# Patient Record
Sex: Female | Born: 1990 | Marital: Single | Smoking: Never smoker
Health system: Northeastern US, Community
[De-identification: ages and names within clinical notes are randomized; demographics above are authoritative.]

## PROBLEM LIST (undated history)

## (undated) ENCOUNTER — Emergency Department (HOSPITAL_BASED_OUTPATIENT_CLINIC_OR_DEPARTMENT_OTHER): Payer: No Typology Code available for payment source

---

## 2018-07-15 ENCOUNTER — Emergency Department
Admission: EM | Admit: 2018-07-15 | Discharge: 2018-07-16 | Disposition: A | Discharge status: Other Acute Inpatient Hospital | Payer: No Typology Code available for payment source | Source: Intra-hospital | Attending: Emergency Medicine | Admitting: Emergency Medicine

## 2018-07-15 ENCOUNTER — Other Ambulatory Visit (HOSPITAL_BASED_OUTPATIENT_CLINIC_OR_DEPARTMENT_OTHER): Payer: Self-pay | Admitting: PCMHI

## 2018-07-15 ENCOUNTER — Telehealth (HOSPITAL_BASED_OUTPATIENT_CLINIC_OR_DEPARTMENT_OTHER): Payer: Self-pay | Admitting: Family Medicine

## 2018-07-15 ENCOUNTER — Ambulatory Visit: Payer: No Typology Code available for payment source | Attending: Family Medicine | Admitting: Family Medicine

## 2018-07-15 ENCOUNTER — Encounter (HOSPITAL_BASED_OUTPATIENT_CLINIC_OR_DEPARTMENT_OTHER): Payer: Self-pay | Admitting: Family Medicine

## 2018-07-15 VITALS — BP 121/80 | HR 102 | Temp 98.6°F | Ht 59.45 in | Wt 121.0 lb

## 2018-07-15 DIAGNOSIS — F322 Major depressive disorder, single episode, severe without psychotic features: Secondary | ICD-10-CM | POA: Diagnosis present

## 2018-07-15 DIAGNOSIS — F411 Generalized anxiety disorder: Principal | ICD-10-CM | POA: Insufficient documentation

## 2018-07-15 DIAGNOSIS — F418 Other specified anxiety disorders: Principal | ICD-10-CM | POA: Diagnosis present

## 2018-07-15 DIAGNOSIS — Z0000 Encounter for general adult medical examination without abnormal findings: Secondary | ICD-10-CM | POA: Diagnosis present

## 2018-07-15 DIAGNOSIS — R45851 Suicidal ideations: Secondary | ICD-10-CM | POA: Diagnosis present

## 2018-07-15 DIAGNOSIS — F410 Panic disorder [episodic paroxysmal anxiety] without agoraphobia: Secondary | ICD-10-CM | POA: Diagnosis not present

## 2018-07-15 LAB — BASIC METABOLIC PANEL
ANION GAP: 12 mmol/L (ref 5–15)
BUN (UREA NITROGEN): 10 mg/dL (ref 7–18)
CALCIUM: 9.2 mg/dL (ref 8.5–10.1)
CARBON DIOXIDE: 26 mmol/L (ref 21–32)
CHLORIDE: 104 mmol/L (ref 98–107)
CREATININE: 0.9 mg/dL (ref 0.4–1.2)
ESTIMATED GLOMERULAR FILT RATE: >60 mL/min (ref >60)
Glucose Random: 109 mg/dL (ref 74–160)
POTASSIUM: 3.4 mmol/L — ABNORMAL LOW (ref 3.5–5.1)
SODIUM: 142 mmol/L (ref 136–145)

## 2018-07-15 LAB — URINE DRUG SCREEN
AMPHETAMINES URINE: NEGATIVE
BENZODIAZEPINES URINE: NEGATIVE
CANNABINOIDS URINE: NEGATIVE
COCAINE METABOLITES URINE: NEGATIVE
ETHANOL URINE: NEGATIVE
OPIATES URINE: NEGATIVE

## 2018-07-15 LAB — CBC, PLATELET & DIFFERENTIAL
ABSOLUTE BASO COUNT: 0 10*3/uL (ref 0.0–0.1)
ABSOLUTE EOSINOPHIL COUNT: 0 10*3/uL (ref 0.0–0.8)
ABSOLUTE IMM GRAN COUNT: 0.01 10*3/uL (ref 0.00–0.03)
ABSOLUTE LYMPH COUNT: 1.5 10*3/uL (ref 0.6–5.9)
ABSOLUTE MONO COUNT: 0.6 10*3/uL (ref 0.2–1.4)
ABSOLUTE NEUTROPHIL COUNT: 6.2 10*3/uL (ref 1.6–8.3)
BASOPHIL %: 0.4 % (ref 0.0–1.2)
EOSINOPHIL %: 0.2 % (ref 0.0–7.0)
HEMATOCRIT: 36.3 % (ref 34.1–44.9)
HEMOGLOBIN: 12.6 g/dL (ref 11.2–15.7)
IMMATURE GRANULOCYTE %: 0.1 % (ref 0.0–0.4)
LYMPHOCYTE %: 18.3 % (ref 15.0–54.0)
MEAN CORP HGB CONC: 34.7 g/dL (ref 31.0–37.0)
MEAN CORPUSCULAR HGB: 29.7 pg (ref 26.0–34.0)
MEAN CORPUSCULAR VOL: 85.6 fL (ref 80.0–100.0)
MEAN PLATELET VOLUME: 10.1 fL (ref 8.7–12.5)
MONOCYTE %: 6.7 % (ref 4.0–13.0)
NEUTROPHIL %: 74.3 % (ref 40.0–75.0)
PLATELET COUNT: 306 10*3/uL (ref 150–400)
RBC DISTRIBUTION WIDTH STD DEV: 41.1 fL (ref 35.1–46.3)
RBC DISTRIBUTION WIDTH: 13.3 % (ref 11.5–14.3)
RED BLOOD CELL COUNT: 4.24 M/uL (ref 3.90–5.20)
WHITE BLOOD CELL COUNT: 8.3 10*3/uL (ref 4.0–11.0)

## 2018-07-15 LAB — SERUM DRUG SCREEN
ACETAMINOPHEN: <2 ug/mL (ref 10–30)
ETHANOL: <3 mg/dL (ref 0–3)
SALICYLATE: <2.8 mg/dL (ref 2.8–20.0)

## 2018-07-15 LAB — URINE PREGNANCY TEST (POINT OF CARE): HCG QUALITATIVE URINE: NEGATIVE

## 2018-07-15 MED ORDER — LORAZEPAM 1 MG PO TABS
1.0000 mg | ORAL_TABLET | Freq: Once | ORAL | Status: AC
Start: 2018-07-15 — End: 2018-07-15
  Administered 2018-07-15: 1 mg via ORAL
  Filled 2018-07-15: qty 1
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
## 2018-07-15 NOTE — Narrator Note (Signed)
Pt demanding to speak to mother, pt helped to find phone number and now is speaking to mother stating she needs to come see her.

## 2018-07-15 NOTE — Narrator Note (Signed)
Pts family at bedside

## 2018-07-15 NOTE — Narrator Note (Signed)
RN giving handoff Sharlyn Bologna  RN receiving handoff Leah RN    Patient updated of plan of care Yes  Room Safety Check Done:  Yes  Patient Belongings Documented: Yes  Outstanding orders/meds addressed N/A  Patient Needs Addressed (Pain/Bathroom/ADL's) Yes  Restraints Documented: No  Is patient on constant observation Yes

## 2018-07-15 NOTE — Progress Notes (Signed)
I personally interviewed and examined this patient, along with Dr. Virl Cagey. I confirm the key elements of the history and physical, and agree with the assessment and plan as documented in the visit note.     Noreene Filbert, MD        Pt expressing feelings of extreme anxiety and depression. Recently moved from [place]. Overwhelming family situations. In private with resident expressed notions of suicidality, thinking daily about death. She knows wear a rope is and has considered using it. Also has considered placing an appliance in the bathtub. Initially denied intent to do anything tonight. No access to guns. On-call therapist consulted. Met with patient and in agreement that she is acutely suicidal. She was making multiple impulsive statements in the room. Writing on herself with pen and broke the pen. Decision made to complete Section-12 and have patient emergently evaluated for inpatient psychiatric admission. Ambulance and security notified. Pt and family notified and are in agreement.

## 2018-07-15 NOTE — Narrator Note (Signed)
Pt's family left phone number [phone] if any decision are made, pt agreeable to this, wants mother to be included.

## 2018-07-15 NOTE — Narrator Note (Signed)
Pt medicated for anxiety, mother stated pt is unable to sleep without medications. Pt's affect is flat, but accepted PO medication.

## 2018-07-15 NOTE — Narrator Note (Signed)
[facility].

## 2018-07-15 NOTE — Narrator Note (Signed)
Pt provided with pillow and blankets for comfort. Pt given box lunch per request.

## 2018-07-15 NOTE — Progress Notes (Unsigned)
NURSE from [facility] called the [facility] to complete a benefit analysis for the TDAP, HPV Vaccine.    The vaccine is covered under the patients prescription coverage.    Please choose Prior Auth
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
## 2018-07-15 NOTE — Narrator Note (Signed)
Pt changed belongings placed in a bin.

## 2018-07-15 NOTE — Telephone Encounter (Signed)
-----   Message from Su Hoff, LPN sent at 0/71/2524  1:26 PM EDT -----  Regarding: benefit review  Vaccine Benefit Analysis:    Needed for Tdap/HPV vaccine(s).  Is patient currently waiting in clinic? No - Appt Date: 08/01    Route to the nursing pool?  No
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
## 2018-07-16 ENCOUNTER — Encounter (HOSPITAL_BASED_OUTPATIENT_CLINIC_OR_DEPARTMENT_OTHER): Payer: Self-pay

## 2018-07-16 ENCOUNTER — Inpatient Hospital Stay
Admission: RE | Admit: 2018-07-16 | Discharge: 2018-07-20 | DRG: 756 | Disposition: A | Discharge status: Home / Self Care | Payer: No Typology Code available for payment source | Attending: Psychiatry | Admitting: Psychiatry

## 2018-07-16 DIAGNOSIS — Z62810 Personal history of physical and sexual abuse in childhood: Secondary | ICD-10-CM | POA: Diagnosis present

## 2018-07-16 DIAGNOSIS — F4312 Post-traumatic stress disorder, chronic: Secondary | ICD-10-CM | POA: Diagnosis present

## 2018-07-16 DIAGNOSIS — F410 Panic disorder [episodic paroxysmal anxiety] without agoraphobia: Secondary | ICD-10-CM | POA: Diagnosis present

## 2018-07-16 DIAGNOSIS — T43595A Adverse effect of other antipsychotics and neuroleptics, initial encounter: Secondary | ICD-10-CM | POA: Diagnosis not present

## 2018-07-16 DIAGNOSIS — F329 Major depressive disorder, single episode, unspecified: Secondary | ICD-10-CM | POA: Diagnosis present

## 2018-07-16 DIAGNOSIS — F419 Anxiety disorder, unspecified: Secondary | ICD-10-CM | POA: Diagnosis not present

## 2018-07-16 DIAGNOSIS — G2571 Drug induced akathisia: Secondary | ICD-10-CM | POA: Diagnosis not present

## 2018-07-16 DIAGNOSIS — R45851 Suicidal ideations: Secondary | ICD-10-CM | POA: Diagnosis not present

## 2018-07-16 DIAGNOSIS — F322 Major depressive disorder, single episode, severe without psychotic features: Secondary | ICD-10-CM

## 2018-07-16 DIAGNOSIS — F4310 Post-traumatic stress disorder, unspecified: Secondary | ICD-10-CM | POA: Diagnosis not present

## 2018-07-16 MED ORDER — MAGNESIUM HYDROXIDE 400 MG/5ML PO SUSP
30.0000 mL | Freq: Two times a day (BID) | ORAL | Status: DC | PRN
Start: 2018-07-16 — End: 2018-07-20

## 2018-07-16 MED ORDER — MIRTAZAPINE 15 MG PO TABS
15.0000 mg | ORAL_TABLET | Freq: Every evening | ORAL | Status: DC
Start: 2018-07-16 — End: 2018-07-16

## 2018-07-16 MED ORDER — CLONAZEPAM 0.5 MG PO TABS
0.5000 mg | ORAL_TABLET | Freq: Two times a day (BID) | ORAL | Status: DC | PRN
Start: 2018-07-16 — End: 2018-07-17
  Administered 2018-07-16: 0.5 mg via ORAL
  Filled 2018-07-16: qty 1

## 2018-07-16 MED ORDER — ACETAMINOPHEN 325 MG PO TABS
650.0000 mg | ORAL_TABLET | ORAL | Status: DC | PRN
Start: 2018-07-16 — End: 2018-07-20

## 2018-07-16 MED ORDER — FLUOXETINE HCL 10 MG PO CAPS
10.0000 mg | ORAL_CAPSULE | Freq: Every day | ORAL | Status: DC
Start: 2018-07-17 — End: 2018-07-19
  Administered 2018-07-17 – 2018-07-19 (×3): 10 mg via ORAL
  Filled 2018-07-16 (×3): qty 1

## 2018-07-16 MED ORDER — FLUOXETINE HCL 10 MG PO CAPS
10.0000 mg | ORAL_CAPSULE | Freq: Every day | ORAL | Status: DC
Start: 2018-07-16 — End: 2018-07-16

## 2018-07-16 MED ORDER — OLANZAPINE 5 MG PO TBDP
2.5000 mg | ORAL_TABLET | Freq: Every day | ORAL | Status: DC | PRN
Start: 2018-07-16 — End: 2018-07-17
  Administered 2018-07-17: 2.5 mg via ORAL
  Filled 2018-07-16: qty 1

## 2018-07-16 MED ORDER — MELATONIN 3 MG PO TABS
3.0000 mg | ORAL_TABLET | Freq: Every evening | ORAL | Status: DC | PRN
Start: 2018-07-16 — End: 2018-07-18
  Administered 2018-07-16: 3 mg via ORAL
  Filled 2018-07-16: qty 1

## 2018-07-16 MED ORDER — HYDROXYZINE HCL 25 MG PO TABS
25.0000 mg | ORAL_TABLET | Freq: Three times a day (TID) | ORAL | Status: DC | PRN
Start: 2018-07-16 — End: 2018-07-16

## 2018-07-16 MED ORDER — ALUMINUM-MAGNESIUM-SIMETHICONE 200-200-20 MG/5ML PO SUSP
30.0000 mL | ORAL | Status: DC | PRN
Start: 2018-07-16 — End: 2018-07-20

## 2018-07-16 MED ORDER — TRAZODONE HCL 50 MG PO TABS
50.0000 mg | ORAL_TABLET | Freq: Every evening | ORAL | Status: DC | PRN
Start: 2018-07-16 — End: 2018-07-16

## 2018-07-16 NOTE — Narrator Note (Signed)
Pt is calm, laying on stretcher and watching through her window.
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
## 2018-07-16 NOTE — Narrator Note (Signed)
Pt ambulated out to nurses station and asks at what time her 72 hour held started as she was informed this morning that she had a bed.

## 2018-07-16 NOTE — Narrator Note (Signed)
Pt is aware that she will be an inpatient bed search. Pt remains calm.

## 2018-07-16 NOTE — Narrator Note (Signed)
Patient sleeping, chest rise and fall equal, patient in NAD, continue to monitor, security standing by. Awaiting eliot screening

## 2018-07-16 NOTE — Narrator Note (Signed)
[facility] screening in process. Pt family member Netty Starring called [phone] asks that pt call him when she is available.

## 2018-07-16 NOTE — Narrator Note (Signed)
Patient sleeping, chest rise and fall equal, patient in NAD, continue to monitor, security standing by.

## 2018-07-16 NOTE — Narrator Note (Signed)
Report to [place] [facility] ,

## 2018-07-16 NOTE — Progress Notes (Signed)
Patient signed CV after talking to Doctor. Talked to mom and dad on the phone and wanted nurse to speak with them as well, giving verbal permission.  Patient was visible on unit and anxious. Compliant with medications and meals but somewhat paranoid about staff and sleeping in a room alone. Patient blockaded door d/t fear but told nurse she would feel safer with a room-mate. Room change done so that patient would have a roommate.

## 2018-07-16 NOTE — Narrator Note (Signed)
Report to Kerry RN

## 2018-07-16 NOTE — ED Provider Notes (Signed)
Patient signed out to me by Dr. Jani Files.    ED Course as of Jul 16 1514   Fri Jul 16, 2018   0714 26 yo woman with anxiety/SI. Medically clear. Sent here on a Section 12. Eliot evaluated. Involuntary IPLOC. Bed search.     [MV]     No events during my shift. Signed out to Dr. Leary Roca.

## 2018-07-16 NOTE — Narrator Note (Signed)
Patient sleeping, chest rise and fall equal, patient in NAD, continue to monitor, security standing by. Awaiting [facility] screening
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
## 2018-07-16 NOTE — Narrator Note (Signed)
Pt ambulated to the restroom with steady gait. Awaiting eliot screening. patient in NAD, continue to monitor, security standing by.

## 2018-07-16 NOTE — Narrator Note (Signed)
Pt is sitting on stretcher and talking calmly with roommate.

## 2018-07-16 NOTE — Narrator Note (Signed)
Assumed care of pt who is awake and awaits [facility] screening. Pt is calm at this time,.

## 2018-07-16 NOTE — Narrator Note (Signed)
[facility] called and admission process initiated.
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
## 2018-07-16 NOTE — Narrator Note (Signed)
Patient Disposition    Patient education for diagnosis, medications, activity, diet and follow-up.  Patient left ED 4:44 PM.  Patient rep received written instructions.  Interpreter to provide instructions: No    Patient belongings with patient: YES    Have all existing LDAs been addressed? Yes    Have all IV infusions been stopped? Yes    Discharged to: Admit to:  [facility]

## 2018-07-17 DIAGNOSIS — F419 Anxiety disorder, unspecified: Secondary | ICD-10-CM

## 2018-07-17 DIAGNOSIS — F4310 Post-traumatic stress disorder, unspecified: Secondary | ICD-10-CM

## 2018-07-17 DIAGNOSIS — F329 Major depressive disorder, single episode, unspecified: Secondary | ICD-10-CM

## 2018-07-17 DIAGNOSIS — R45851 Suicidal ideations: Secondary | ICD-10-CM

## 2018-07-17 MED ORDER — OLANZAPINE 5 MG PO TBDP
ORAL_TABLET | ORAL | Status: AC
Start: 2018-07-17 — End: 2018-07-17
  Administered 2018-07-17: 10 mg via ORAL
  Filled 2018-07-17: qty 2

## 2018-07-17 MED ORDER — OLANZAPINE 10 MG PO TBDP
10.00 mg | ORAL_TABLET | Freq: Once | ORAL | Status: AC
Start: 2018-07-17 — End: 2018-07-17
  Administered 2018-07-17: 10 mg via ORAL

## 2018-07-17 MED ORDER — OLANZAPINE 10 MG PO TBDP
10.0000 mg | ORAL_TABLET | Freq: Two times a day (BID) | ORAL | Status: DC | PRN
Start: 2018-07-17 — End: 2018-07-18
  Administered 2018-07-17 (×2): 10 mg via ORAL
  Filled 2018-07-17 (×2): qty 1

## 2018-07-17 NOTE — Plan of Care (Addendum)
Problem: Mood-related symptoms  Description  Pt expressed SI related to worsening depression   Goal: Reduction in Intensity and Frequency (Mood)  Description  Patient will be able to express her feelings to staff and identify appropriate coping skills   Note:   Patient woke up this morning very anxious, agitated and restless. 1:1 support given to patient. Patient was not responding to redirections. Patient pacing the halls and attempted to escape the unit when housekeeping open the unit door.This nurse approached  the patient and brought her to her room.Prn Zyprexa 10 mg given with positive effect. Later in shift patient appeared less anxious visible on the unit and took a nap around 1:00 pm. Denies SI/HI/AH/VH. Patient states "Ijust want to get out of here".  Patient remains on 15 min checks and ur for safety.

## 2018-07-17 NOTE — Plan of Care (Signed)
Awake at start of shift. Anxious mood and affect. Declined PRN medication and retired to bed. Awoke around 0230 and was again highly anxious. Accepted zydis 2.5 mg with good effect. Awoke again around 0600 and went down to the TV room. Shortly later pt noted to be yelling out the window "help me, get me out". Remained panicked and minimally responded to support. Dr [name]  informed and an order for Zydis 10 mg obtained and given after initial resistance. Able to return to her room in a calmer state.

## 2018-07-17 NOTE — Plan of Care (Signed)
Problem: Mood-related symptoms  Goal: Reduction in Intensity and Frequency (Mood)  Description  Patient will be able to express her feelings to staff and identify appropriate coping skills   Outcome: Not Progressing   Patient presents with a depressed mood,sad affect,reported feeling anxious,restless.Prn Zyprexa administered with good effect, was able to take a nap, verbalized feeling much better.Had a good visit  with family.Will continue to monitor,offer support,motivation and a therapeutic environment.

## 2018-07-17 NOTE — H&P (Signed)
Department of Psychiatry  Admission Physical Examination, Review of Systems, & Medical Clearance    HPI:   27 yo F w/ PMH depression, anxiety, PTSD w/ increasing anxiety, SI, admitted to [facility], in need of medical clearance.    Social History    Social History Narrative      Not on file    Past Medical Hx:    Patient denies any PMH/ongoing medical issues      Prior to Admission Medications:    No medications prior to admission.    Scheduled Medications:   FLUoxetine  10 mg Oral Daily       PRN Medications:  Current Facility-Administered Medications   Medication Dose Route Frequency Last Dose    OLANZapine zydis  10 mg Oral Q12H PRN 10 mg at 07/17/18 1005    acetaminophen  650 mg Oral Q4H PRN      magnesium hydroxide  30 mL Oral Q12H PRN      aluminum-magnesium hydroxide-simethicone  30 mL Oral Q2H PRN      melatonin  3 mg Oral Nightly PRN 3 mg at 07/16/18 2149       ALLERGIES:   Review of Patient's Allergies indicates:  No Known Allergies    REVIEW OF SYSTEMS:    + anxious    + irregular menses (last "early July")    Remainder of ROS non contributory    PHYSICAL EXAM:  BP 130/81  Pulse 98  Temp 98.4 F (36.9 C) (Temporal)  Resp 18  Ht 5' 0.63" (1.54 m)  Wt 48.1 kg (106 lb)  SpO2 100%  BMI 20.27 kg/m2  Pain Score: 0 (0/10)          General: calm initially, as interview progressed patient appeared increasingly more anxious and started to tremble.  HEENT: wnl  Lungs:clear to A  Heart: normal and regular rate and rhythm  Abdomen: soft, non-tender. . No masses,  no organomegaly  Genital: defer exam  Rectal: deferred  Extremities: extremities normal, atraumatic, no cyanosis or edema  Neurological Screen:   Cranial Nerves:   I: smell: Not tested  II Visual acuity: Grossly intact  III, VI: Ptosis: Absent  III, IV, V: EOM: Intact  V: Mastication/facial light touch: normal  V VII: Corneal reflex: Not tested  VII: Facial muscle function: Normal  VIII: Hearing: Grossly intact  IX: Soft palate elevation:  Not tested  IX, X: Gag reflex: Not tested  XI: Neck flexion/trapezius strength/SCM strength: 5/5  XII: Tongue strength: wnl   Strength: Gait and station normal.. No obvious asymmetry, decrease in ROM, instability or weakness of spine or extremities  Sensation/Position/Pain: normal to touch      Test results:    Admission labs:  wnl  Tox screen negative    HCG:  Negative    Primary Care Physician:  None    Findings & Recommendations:  No new/acute medical issues  (irregular menses can be addressed as outpatient)    The patient has been examined, and the chart has been reviewed,  with findings as documented above.  The patient is medically stable and cleared for admission.  The patient may participate in exercise while in the hospital.    Arville Care, PA-C, 07/17/2018

## 2018-07-18 DIAGNOSIS — F322 Major depressive disorder, single episode, severe without psychotic features: Secondary | ICD-10-CM

## 2018-07-18 MED ORDER — OLANZAPINE 5 MG PO TBDP
5.0000 mg | ORAL_TABLET | Freq: Four times a day (QID) | ORAL | Status: DC | PRN
Start: 2018-07-18 — End: 2018-07-19
  Administered 2018-07-18 – 2018-07-19 (×3): 5 mg via ORAL
  Filled 2018-07-18 (×3): qty 1

## 2018-07-18 MED ORDER — MELATONIN 3 MG PO TABS
1.5000 mg | ORAL_TABLET | Freq: Every evening | ORAL | Status: DC
Start: 2018-07-18 — End: 2018-07-19
  Administered 2018-07-18: 1.5 mg via ORAL
  Filled 2018-07-18: qty 1

## 2018-07-18 NOTE — Plan of Care (Signed)
Problem: Mood-related symptoms  Goal: Reduction in Intensity and Frequency (Mood)  Description  Patient will be able to express her feelings to staff and identify appropriate coping skills   Note:   Pt has been visible on he unit. Pt is calm, quiet and cooperative. Pt was adherent to meals and medications. Pt is selectively social. RN met with pt and pt endorses feeling sad. She denies any SI/HI/AH/VH. Pt states she will like to be discharged home. Pt was encouraged to speak with team. Supportive 1:1 offered and coping skills encouraged. Will continue to monitor for safety.

## 2018-07-18 NOTE — Plan of Care (Signed)
Problem: Mood-related symptoms  Description  Pt expressed SI related to worsening depression   Goal: Reduction in Intensity and Frequency (Mood)  Description  Patient will be able to express her feelings to staff and identify appropriate coping skills   Intervention: (Nurse) Assess Mental Status  Description  RN will complete assessment/reassessment of patient's mental status every shift   Note:   Pt slept well this shift, remains on 15 minute checks. No issues noted

## 2018-07-18 NOTE — Plan of Care (Signed)
Problem: Mood-related symptoms  Description  Pt expressed SI related to worsening depression   Goal: Reduction in Intensity and Frequency (Mood)  Description  Patient will be able to express her feelings to staff and identify appropriate coping skills   07/18/2018 2136 by Karl Luke, RN  Note:   Pt had a long visit with her family working on a puzzle. Pt C/O increase anxiety and requested PRN Olanzapine which was administered with good effect. Pt did socialize with peers. Pt told this Probation officer that she wants to go home and be with her family.Support offered and coping skills encouraged. Pt denies any safety concerns this shift.  07/18/2018 1405 by Karl Luke, RN  Note:   Pt has been visible on he unit. Pt is calm, quiet and cooperative. Pt was adherent to meals and medications. Pt is selectively social. RN met with pt and pt endorses feeling sad. She denies any SI/HI/AH/VH. Pt states she will like to be discharged home. Pt was encouraged to speak with team. Supportive 1:1 offered and coping skills encouraged. Will continue to monitor for safety.

## 2018-07-18 NOTE — Progress Notes (Signed)
Identifying Data:  Patient is a 27 year old female.      INTERVAL HISTORY: Yesterday I decreased her PRN Zypexa to 5 mg, but she remains organized and cooperative, without any major behavioral or medical events overnight, cooperative and compliant with medications, has fair sleep and appetite.     VITALS:  BP 124/78  Pulse 92  Temp 97.6 F (36.4 C) (Temporal)  Resp 18  Ht 5' 0.63" (1.54 m)  Wt 47.2 kg (104 lb 1.6 oz)  SpO2 100%  BMI 19.91 kg/m2   Pain Score: 0 (0/10)    Scheduled Meds:   melatonin  1.5 mg Oral Nightly    FLUoxetine  10 mg Oral Daily       PRN Meds:  Current Facility-Administered Medications   Medication Dose Route Frequency Last Dose    OLANZapine zydis  5 mg Oral Q6H PRN 5 mg at 07/18/18 1648    acetaminophen  650 mg Oral Q4H PRN      magnesium hydroxide  30 mL Oral Q12H PRN      aluminum-magnesium hydroxide-simethicone  30 mL Oral Q2H PRN           Mental Status Examination Notable Findings:  "I feel better ... I draw, I saw my mother and father"  General appearance: acceptable self care, appropriate clothing. Behavior: watching TV before interview, follows me to office, cooperative.  Alertness: awake, alert.   Memory and orientation: at least grossly intact, oriented to place, time, situation.  Speech is fluent, coherent, with age-appropriate rate, prosody, volume. Vocabulary: not formally assessed, but without obvious limitations.  Mannerism/movements: Normal psychomotor activities, no mannerisms.  Thought process is logical, linear.   Thought content and delusions: Not overtly paranoid, there is no formal thought disorder. However, cannot rule out paranoia, says she is "overthinking" and is anxious because staff are "staring" at her.  Hallucinations: Not distracted or preoccupied.  Mood: "I'm better". Affect is constricted but reactive, not as tense as yesterday   No suicidal ideation; future oriented.   No homicidal thoughts.  Insight and judgment are limited.      Review of Patient's  Allergies indicates:  No Known Allergies      C-SSRS:  C-SSRS Frequent Screener  Since last contact, have you actually had thoughts about killing yourself ? : No  Have you done anything, started to do anything, or prepared to do anything to end your life? : No  C-SSRS Frequent Screener Risk Score: 0    C-SSRS Risk Assessment  Activating Events (Recent): Recent loss(es) or other significant negative event(s) (legal, financial, relationship, etc.) (Describe) (07/16/18 1841)  Protective Factors (Recent): Supportive social network or family (07/16/18 1842)      Assessment and plan:    Primary diagnosis:  MDD, severe  R/o PTSD, r/o psychosis    Continue current treatment.    I spent about 15 minutes face to face with patient and unit/floor time, of which less than 50% was face to face evaluation and more than 50% was coordination of care (including documentation, obtaining information from sign out and/or discussing care with nursing) and/or counseling patient and/or family.        This font size and color are used for regulatory/compliance elements.
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
## 2018-07-18 NOTE — Progress Notes (Addendum)
Identifying Data:  Patient is a 27 year old female, admitted yesterday for severe anxiety, depression, SI.      INTERVAL HISTORY: Was repeatedly very agitated, tried to leave the unit in the morning. Had partial response to Zyprexa 2.5 mg; increased dose to 10 mg, took it 2 today, fell asleep both times after.    VITALS:  BP 130/81  Pulse 98  Temp 98.4 F (36.9 C) (Temporal)  Resp 18  Ht 5' 0.63" (1.54 m)  Wt 48.1 kg (106 lb)  SpO2 100%  BMI 20.27 kg/m2   Pain Score: 0 (0/10)    Scheduled Meds:   melatonin  1.5 mg Oral Nightly    FLUoxetine  10 mg Oral Daily       PRN Meds:  Current Facility-Administered Medications   Medication Dose Route Frequency Last Dose    OLANZapine zydis  10 mg Oral Q12H PRN 10 mg at 07/17/18 1646    acetaminophen  650 mg Oral Q4H PRN      magnesium hydroxide  30 mL Oral Q12H PRN      aluminum-magnesium hydroxide-simethicone  30 mL Oral Q2H PRN           Mental Status Examination Notable Findings:  General appearance: acceptable self care, appropriate clothing. Behavior: cooperative.  Alertness: asleep, but wakes up easily  Memory and orientation: at least grossly intact, oriented to place, time, situation.  Speech is fluent, coherent, with age-appropriate rate, prosody, volume. Vocabulary: not formally assessed, but without obvious limitations.  Mannerism/movements: Normal psychomotor activities, no mannerisms.  Thought process is logical, linear.   Thought content and delusions: men on the unit make her uncomfortable: "they are strong and scare me"  Hallucinations: Not distracted or preoccupied.  Mood: Depressed and anxious. Affect is flat.   No suicidal ideation.   No homicidal thoughts.  Insight and judgment are limited.      Review of Patient's Allergies indicates:  No Known Allergies      C-SSRS:  C-SSRS Frequent Screener  Since last contact, have you actually had thoughts about killing yourself ? : (P) No  Have you done anything, started to do anything, or prepared to do  anything to end your life? : (P) No  C-SSRS Frequent Screener Risk Score: (P) 0    C-SSRS Risk Assessment  Activating Events (Recent): Recent loss(es) or other significant negative event(s) (legal, financial, relationship, etc.) (Describe) (07/16/18 1841)  Protective Factors (Recent): Supportive social network or family (07/16/18 1842)      Assessment and plan:    Primary diagnosis:  MDD, severe   R/o PTSD (r/o, maybe, psychosis?)  Continue current treatment.    I spent about 15 minutes face to face with patient and unit/floor time, of which less than 50% was face to face evaluation and more than 50% was coordination of care (including documentation, obtaining information from sign out and/or discussing care with nursing) and/or counseling patient and/or family.        This font size and color are used for regulatory/compliance elements.

## 2018-07-19 ENCOUNTER — Other Ambulatory Visit (HOSPITAL_BASED_OUTPATIENT_CLINIC_OR_DEPARTMENT_OTHER): Payer: Self-pay | Admitting: Social Worker

## 2018-07-19 ENCOUNTER — Other Ambulatory Visit (HOSPITAL_BASED_OUTPATIENT_CLINIC_OR_DEPARTMENT_OTHER): Payer: Self-pay | Admitting: Psychiatry

## 2018-07-19 MED ORDER — OLANZAPINE 5 MG PO TBDP
5.0000 mg | ORAL_TABLET | Freq: Every evening | ORAL | Status: DC
Start: 2018-07-19 — End: 2018-07-20
  Administered 2018-07-19: 5 mg via ORAL
  Filled 2018-07-19: qty 1

## 2018-07-19 MED ORDER — FLUOXETINE HCL 20 MG PO CAPS: 20 mg | capsule | Freq: Every day | ORAL | 0 refills | 0 days | Status: DC

## 2018-07-19 MED ORDER — CLONAZEPAM 1 MG PO TABS
1.0000 mg | ORAL_TABLET | Freq: Two times a day (BID) | ORAL | Status: DC
Start: 2018-07-19 — End: 2018-07-20
  Administered 2018-07-19 – 2018-07-20 (×2): 1 mg via ORAL
  Filled 2018-07-19 (×2): qty 1

## 2018-07-19 MED ORDER — OLANZAPINE 5 MG PO TBDP
5.0000 mg | ORAL_TABLET | Freq: Every evening | ORAL | 0 refills | Status: DC | PRN
Start: 2018-07-20 — End: 2018-08-09

## 2018-07-19 MED ORDER — CLONAZEPAM 1 MG PO TABS
1.0000 mg | ORAL_TABLET | Freq: Two times a day (BID) | ORAL | 0 refills | Status: DC
Start: 2018-07-19 — End: 2018-08-09

## 2018-07-19 MED ORDER — MELATONIN 3 MG PO TABS
3.0000 mg | ORAL_TABLET | Freq: Every evening | ORAL | Status: DC
Start: 2018-07-19 — End: 2018-07-20
  Administered 2018-07-19: 3 mg via ORAL
  Filled 2018-07-19: qty 1

## 2018-07-19 MED ORDER — MELATONIN 3 MG PO TABS: 3 mg | tablet | Freq: Every evening | ORAL | 0 refills | 0 days | Status: DC

## 2018-07-19 MED ORDER — MELATONIN 3 MG PO TABS
3.0000 mg | ORAL_TABLET | Freq: Every evening | ORAL | 0 refills | Status: DC
Start: 2018-07-19 — End: 2018-08-09

## 2018-07-19 MED ORDER — OLANZAPINE 5 MG PO TBDP: 5 mg | tablet | Freq: Every evening | ORAL | 0 refills | 0 days | Status: DC | PRN

## 2018-07-19 MED ORDER — CLONAZEPAM 1 MG PO TABS
1.00 mg | ORAL_TABLET | Freq: Once | ORAL | Status: AC
Start: 2018-07-19 — End: 2018-07-19
  Administered 2018-07-19: 1 mg via ORAL
  Filled 2018-07-19: qty 1

## 2018-07-19 MED ORDER — CLONAZEPAM 1 MG PO TABS: 1 mg | tablet | Freq: Two times a day (BID) | ORAL | 0 refills | 0 days | Status: DC

## 2018-07-19 MED ORDER — FLUOXETINE HCL 20 MG PO CAPS
20.0000 mg | ORAL_CAPSULE | Freq: Every day | ORAL | Status: DC
Start: 2018-07-20 — End: 2018-07-20
  Administered 2018-07-20: 20 mg via ORAL
  Filled 2018-07-19: qty 1

## 2018-07-19 MED ORDER — FLUOXETINE HCL 20 MG PO CAPS
20.0000 mg | ORAL_CAPSULE | Freq: Every day | ORAL | 0 refills | Status: DC
Start: 2018-07-20 — End: 2018-08-09

## 2018-07-19 NOTE — Plan of Care (Signed)
Pt in bed most of the night. Observed to be sleeping and safe during 15 minute checks. No complaints voiced. No distress noted.

## 2018-07-19 NOTE — Progress Notes (Addendum)
PSYCH PROGRESS NOTE    Identifying Data:  Patient is a 27 year old female that was admitted on 07/16/18    With hx of MDD/o   Patient Active Problem List:     MDD (major depressive disorder), severe ([place])       Legal Status: Conditional Voluntary    INTERVAL HISTORY:   Met with pt and treatment team this am to discuss her case.  The pt had evidence of Akathisia, which she developed acutely, after receiving high doses of Zydis over the weekend.  The pt was suffering from panic attacks and attempted to elope with housekeeping exit.  The on-call physician, did use the Zydis, mainly to treat agitation, and d/c'ed the pt's Klonopin.  The pt is suffering from depression and panic attacks.  The pt reported that the low dose Zydis, was helpful for flashbacks and nightmares, however, the high dosage, "was too strong, the pt reported "I felt like ants in my pants of something Doc"  The pt was reassured that the treatment team will remain the same during the week, and this TW, would re-start he Klonopin and maintain her Zydis at the low dose at bedtime.  The pt reports the Klonopin, has been effective to treat her Panic attacks, so this was increased to 81m po bid. The pt reports, she is not an elopement risk and agreed to remain today and most of tomorrow in order to have her medications adjusted and detailed to her panic disorder and depression.  Met with pt and family for an in prompt to family meeting and pt's mother plans to pick her up tomorrow with family friend after 3pm tomorrow.     Allergies:  Review of Patient's Allergies indicates:  No Known Allergies    Scheduled Meds:  Current Facility-Administered Medications   Medication Dose Route Frequency Last Dose    [START ON 07/20/2018] FLUoxetine (PROzac) capsule 20 mg  20 mg Oral Daily      melatonin tablet 3 mg  3 mg Oral Nightly      OLANZapine zydis (ZyPREXA) disintegrating tablet 5 mg  5 mg Oral Nightly      clonazePAM (KLONOPIN) tablet 1 mg  1 mg Oral BID            PRN Meds:  Current Facility-Administered Medications   Medication Dose Route Frequency Last Dose    acetaminophen  650 mg Oral Q4H PRN      magnesium hydroxide  30 mL Oral Q12H PRN      aluminum-magnesium hydroxide-simethicone  30 mL Oral Q2H PRN      need to edit smartlink to show generic and brand and formulation        VITALS:  Patient Vitals for the past 24 hrs:   BP Temp Temp src Pulse Resp SpO2   07/19/18 0700 123/79 97.9 F (36.6 C) TEMPORAL 101 16 100 %       MENTAL STATUS EXAM:   Mental Status  General Appearance: Dressed appropriately  Behavior: Tense  Level of Consciousness: Alert  Orientation Level: Oriented x3  Attention/Concentration: Distracted  Mannerisms/Movements: Restless/Pacing  Speech Rate: Rapid  Speech Clarity: Clear  Speech Tone: Normal vocal inflection  Vocabulary/Fund of Knowledge: WNL  Memory: Grossly intact  Thought Process: Linear  Dissociative Symptoms: Intrusive memories;Nightmares  Thought Content: No abnormalities reported or observed  Hallucinations: None  Suicidal Thoughts: None  Homicidal Thoughts: None  Mood: Irritable          Mood Comment: ("I want  to go home!")  Affect: Anxious;Fearful  Judgment: Poor  Insight: Fair    C-SSRS:     Default Flowsheet Data (most recent)      C-SSRS Frequent Screener - 07/19/18 1800        C-SSRS Frequent Screener    Since last contact, have you actually had thoughts about killing yourself ?   No     Have you done anything, started to do anything, or prepared to do anything to end your life?   No              Default Flowsheet Data (most recent)      C-SSRS Risk Assessment - 07/19/18 1903        C-SSRS Risk Assessment    Suicidal Ideation Check Most Severe in Past Month  Suicidal thoughts     Activating Events (Recent)  Recent loss(es) or other significant negative event(s) (legal, financial, relationship, etc.) (Describe)     Protective Factors (Recent)  Supportive social network or family;Responsibility to family or others, living  with family           Physical/Somatic Complaints  The patient lists: no physical complaints.          DSM-V    Primary Diagnosis:  Panic disorder   Secondary Diagnosis:  Depressive disorder        Informed Consent for Antipsychotics - Sun July 18, 2018     [place] Name 0000       Informed Consent for Antipsychotics    Olanzapine (Zyprexa)  Patient shows some resistance to treatment plan but will encourage based on clinical needs    -GV at 07/18/18 0009      User Key  (r) = Recorded By, (t) = Taken By, (c) = Cosigned By    [place] Name Provider Type Discipline    GV Zeb Comfort, MD Physician Psychiatrist          Assessment  26 you [place] [facility] presented with Panic d/o and SI on Friday to [facility].  The pt had a difficult weekend due to over medication with Zyprexa.  The pt will currently is tolerating medications well and is in behavioral control.     Plan  Increase Prozac to 20 mg po Qam upon discharge  Klonopin 26m po bid to treat panic d/o  Zydis 5 mg po qhs-prn flashbacks and nightmares associated with PTSD/o      Review with patient: Treatment plan reviewed with the patient.

## 2018-07-19 NOTE — Group Note (Signed)
OT Psych Group Documentation    Topics: Building Your Safety Tool/Sensory Resources      Date of group: August 5  Start Time: 1100  End Time: 1145    Attendance: Declined  Comments: seemed to express interest, follow TW briefly down hall and asked where group was taking place, was given description and room number, though pt did not join group    Avelina Laine, OTR/L [phone]
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
## 2018-07-19 NOTE — Progress Notes (Signed)
Thank you for your referral.  It was forwarded to Psychiatry Access Service (PAS)for further review.

## 2018-07-19 NOTE — Plan of Care (Signed)
Problem: Mood-related symptoms  Goal: Reduction in Intensity and Frequency (Mood)  Description  Patient will be able to express her feelings to staff and identify appropriate coping skills   Note:   Patient up this morning visible on the unit. Patient reported feeling anxious and asking to meet with the Dr to be discharged. Affect  constricted. Patient denied any unsafe thoughts. Denied SI/HI/AVH. Patient is medication compliant. Attended groups. Appetite is appropriate. Patient met with the Dr and signed a three day notice. Patient maintained appropriate behavior.

## 2018-07-19 NOTE — Group Note (Signed)
OT Psych Group Documentation    Topics: Community Meeting               Date of group: August 5  Start Time: 0900  End Time: 0925    Attendance: Entire group  Participation/Patient Response: Contributed to discussion  Participation within a task: No tasks component to group  Treatment Goals Addressed: Development/maintenance of a health lifestyle, Increased coping skills, Increased self awareness, Increased self-esteem/self-worth and Mood/emotion regulation    Comments: Pt appeared calm and appropriate throughout group. Pt reported her daily goal was to talk with her doctor, stating she "just wants to go home". Pt shared she is grateful for being alive and for her family.     Henrene Dodge, [place]

## 2018-07-19 NOTE — Group Note (Signed)
OT Psych Group Documentation    Topics: Self-Advocacy      Date of group: August 5  Start Time: 6940  End Time: [place]    Attendance: Declined  Comments:     Avelina Laine, OTR/L [phone]

## 2018-07-19 NOTE — Group Note (Signed)
OT Psych Group Documentation    Topics: [employer]              Date of group: August 5  Start Time: 0300  End Time: 0345    Attendance: Fritzi Mandes  Comments:     Henrene Dodge, [place]

## 2018-07-19 NOTE — Progress Notes (Signed)
Progress Note - Social Work   Clinical:   Pt discussed in multidisciplinary team meeting. Pt was mildly irritable when seen this morning, requests discharge repeatedly, "I'm fine now, I'm not suicidal, I need a second chance, I want to go out and live my life". Confirms that she lives in [place] with her mother and aunt, is hoping to find employment after dc. She agrees to see a therapist in the community (d/t insurance status, PAS clinic referral placed). She denies SI/HI/AVH. Met with MDs later in shift, signed 3-day notice.   Collateral:   Mother: Dairl Ponder [phone]  Aftercare Planning:   - Meet with patient daily to assess mood and monitor progress  - Speak with collaterals to gain collateral information  - Encourage patient to engage in the milieu/groups while on the unit  - Evaluate living situation and support system. Facilitate family meetings as needed.  - Coordinate treatment and discharge planning with current supports.   - Discharge from [facility]

## 2018-07-19 NOTE — Plan of Care (Signed)
Problem: Mood-related symptoms  Description  Pt expressed SI related to worsening depression   Goal: Reduction in Intensity and Frequency (Mood)  Description  Patient will be able to express her feelings to staff and identify appropriate coping skills   Outcome: Progressing  Note:   Pt was calm, mostly with a blunted affect. She was visible on the unit, enjoyed a good visit with her parents. She denied si/hi/vh/ah, presented with no unsafe behavior. She is eating, drinking adequate fluids, and taking scheduled medications.

## 2018-07-20 MED FILL — FLUOXETINE 20MG CAPS: 30 days supply | Qty: 30 | Fill #0 | Status: CP

## 2018-07-20 MED FILL — *MELATONIN  3MG: 30 days supply | Qty: 30 | Fill #0 | Status: CP

## 2018-07-20 MED FILL — OLANZAPINE 5MG: 28 days supply | Qty: 30 | Fill #0 | Status: CP

## 2018-07-20 MED FILL — CLONAZEPAM  1MG: 30 days supply | Qty: 60 | Fill #0 | Status: CP

## 2018-07-20 NOTE — Progress Notes (Signed)
Discharge Note-Social Work   Clinical:   Pt is a 27 year old [place] female with a hx of complex trauma, and past suicide attempts who was was admitted to this unit on 07/16/2018 presenting initially with increased suicidal ideation.  Please see prior SW notes for more complete details related to pts treatment course, disposition planning and collateral contacts. Pt signed 3-day notice yesterday.   Pt was calm, cooperative, future oriented when seen on the morning of discharge, she denies SI/HI/AVH, and was looking forward to dc today. Notes she wants to "live my life", and plans to find art classes to enroll in or find employment. She is agreeable to PAS therapy appt, and was able to engage in crisis planning, and identify supports she could speak to if SI returned. She will be picked up by her mother/step father this afternoon.   Collateral  Contacted pt's mother, Dairl Ponder, [phone], with interpreter prior to discharge. She had no safety concerns re: pt being discharged, understood treatment recommendations. Plans to review AVS so she is aware of medication regimen.     Follow Up Information:     Aug12 PS INTAKE 60 MINUTES with Fidel Levy, LICSW   Monday Jul 26, 2018 9:00 AM  [facility] - Psychiatry Access Service   [address] [place] [address]  [place] [address]   [phone]

## 2018-07-20 NOTE — Plan of Care (Signed)
Pt rested comfortably this shift on 15 min checks. No voiced complaints or safety concerns.

## 2018-07-20 NOTE — Discharge Instructions (Signed)
Outpatient Pharmacy:     [facility]  [address]  [place], [place] [address]  Phone: [phone]  Hours: Monday- Thursday 8am-8pm; [facility] [place],  [id number]  Phone: [phone]  Hours: Monday-Friday 8:30am-6:30pm; Saturday/Sunday: 9:00am-3:00pm

## 2018-07-20 NOTE — Discharge Summary (Signed)
PSYCH ADULT DISCHARGE SUMMARY     Date of Admission: 07/16/18    Date of Discharge: 07/20/18    Summary of Reason for Admission: pt was admitted due to exacerbation of her Panic d/o, the pt expressed Si and BIBA for psychiatric evaluation and treatment.  The pt had a 'rough" weekend, however , improved once the FDA approved agents were initiated to treat her .  The pt reports the Prozac and Klonopin, were very effective in tx her depression and panic disorder.    Hospital Course: pt did have a "rough" weekend, however, once the perm treatment team arrived and initiated therapeutic measures, she adapted and cooperated with treatment.  The pt had a d/c family meeting  With this provider late Monday, which went well and she accepted the medications and family education entailed with much success.     Consults: none    Recent Labs:   Lab Results   Component Value Date    NA 142 07/15/2018    K 3.4 (L) 07/15/2018    CL 104 07/15/2018    CO2 26 07/15/2018    BUN 10 07/15/2018    CREAT 0.9 07/15/2018    GLUCOSER 109 07/15/2018     Lab Results   Component Value Date    WBC 8.3 07/15/2018    HGB 12.6 07/15/2018    HCT 36.3 07/15/2018    PLTA 306 07/15/2018       Significant Diagnostic Studies:   labs: wnl    Discharge Mental Status:   Mental Status  General Appearance: Dressed appropriately  Behavior: Cooperative  Level of Consciousness: Alert  Orientation Level: Oriented x3  Attention/Concentration: WNL  Mannerisms/Movements: No abnormal mannerisms/movements  Speech Rate: WNL  Speech Clarity: Clear  Speech Tone: Normal vocal inflection  Vocabulary/Fund of Knowledge: WNL  Memory: Intact  Thought Process: Goal-directed  Dissociative Symptoms: None  Thought Content: No abnormalities reported or observed  Hallucinations: None  Suicidal Thoughts: None  Homicidal Thoughts: None  Mood: Euthymic          Mood Comment: ("I want to go home!")  Affect: Full range  Judgment: Fair  Insight: Fair    C-SSRS:         Default Flowsheet Data  (most recent)      C-SSRS Risk Assessment - 07/20/18 1618        C-SSRS Risk Assessment    Suicidal Ideation Check Most Severe in Past Month  Suicidal thoughts     Protective Factors (Recent)  Supportive social network or family;Belief that suicide is immoral, high spirituality           DSM-V    Primary Diagnosis:  Panic disorder   Secondary Diagnosis:  Depressive disorder          Discharge Case Formulation: pt is a 27 yo [place], recently immigrated to [place], with high level of intelligence. The pt presented with acute panic attacks, which led to SI and subsequent hospitalization.  The pt did respond well to the medications and Zydis 5mg  po at bedtime as needed was ordered for "Flashbacks' associated with pt's PTSD/o-chronic     Discharge Risk Assessment: low risk    Discharge Medications:      Medication List      START taking these medications      Instructions   clonazePAM 1 MG tablet  Dose:  1 mg  Quantity:  60 tablet  Refills:  0  common name:  KLONOPIN   Take 1 tablet by  mouth 2 (two) times daily     FLUoxetine 20 MG capsule  Dose:  20 mg  Quantity:  30 capsule  Refills:  0  common name:  PROzac   Take 1 capsule by mouth daily     melatonin 3 MG Tabs tablet  Dose:  3 mg  Quantity:  30 tablet  Refills:  0   Take 1 tablet by mouth nightly     OLANZapine zydis 5 MG disintegrating tablet  Dose:  5 mg  Quantity:  30 tablet  Refills:  0  common name:  ZyPREXA   Take 1 tablet by mouth nightly as needed (flashbacks)           Where to Get Your Medications      Information about where to get these medications is not yet available    Ask your nurse or doctor about these medications   clonazePAM 1 MG tablet   FLUoxetine 20 MG capsule   melatonin 3 MG Tabs tablet   OLANZapine zydis 5 MG disintegrating tablet           Current number of prescribed, standing, antipsychotic meds:                 We discussed risks associated with antipsychotics, including extrapyramidal symptoms (EPS), weight gain, metabolic syndrome,  cardiac problems, and tardive dyskinesia (TD).  We discussed the nature of EPS (stiffness, tremor, restlessness) and strategies for managing them, should they appear. Patient/Guardian understands that we will need to regularly monitor for weight gain and signs of metabolic disruption (increased serum cholesterol, glucose, and/or triglycerides) and take steps to prevent them because these effects can lead to diabetes and its associated health risks. Patient/Gardian understands black box warning.  Patient/Guardian also understands that we will need to regularly monitor for evidence of TD, and that TD symptoms, once they appear, may be irreversible.          Discharge Activity: activity as tolerated    Discharge Diet: regular diet    "Hand-Off" Transition Issues: NONE, had family meeting on Monday night with pt's mother and Step-dad, the meeting went well and the pt was given appointment at 1, this   Transitional clinic will continue to meet pt's mental health care needs and Case manager there, can help pt receive a referral for [facility] support services.  Pt may also f/u with psychiatrist at [facility], for medications.  Pt was given a 30 day supply of medications by this provider.    Disposition/Living Situation:  Anticipated Discharge Plan  Expected Discharge Date: 07/20/18  Lives With: Parent  Anticipated Outcome: Home with family  Address: [address], [place]  HBIPS: The patient/guardian consented to have discharge documentation sent to the next level of care provider: Yes  HBIPS: Discharge documentation was sent to: Therapist  Provider name and type (MD, SW, etc.): Fidel Levy, LICSW  Date of communication: 07/20/18  Time of communication: 58  Method of communication: Access to shared EMR  Transportation at Discharge: Family  Discharge Transportation Arranged: Yes (comment)  Patient expects to be discharged to:: Home  Best time of day to contact: Anytime  Relationship to Patient: Parent  Interpreter  Requested: No  Home Care Services: No  Family Notified of Disposition: Yes  Living Situation  Lives With: Parent  Type of Residence: House/apartment  Living Setting: Apartment    Discharge Instructions:  As per SW and aftercare plans, copy provided for pt upon d/c

## 2018-07-20 NOTE — Group Note (Signed)
OT Psych Group Documentation    Topics: Community Meeting               Date of group: August 6  Start Time: 0900  End Time: 0925    Attendance: Entire group  Participation/Patient Response: Contributed to discussion  Participation within a task: No tasks component to group  Treatment Goals Addressed: Development/maintenance of a health lifestyle, Increased communication skills, Increased self awareness, Increased self-esteem/self-worth and Mood/emotion regulation    Comments: Pt reported her goal for the day was to prepare for upcoming discharge. Pt shared she feels she has gained positive skills while here and feels confident to return back home. Pt shared she feels grateful for her family's support, being alive and her time in the hospital.     Henrene Dodge, [place]

## 2018-07-20 NOTE — Group Note (Signed)
OT Psych Group Documentation    Topics: Building Your Safety Tool/Sensory Resources      Date of group: August 6  Start Time: 1100  End Time: 3374    Attendance: Declined  Comments: resting in bed    Avelina Laine, OTR/L [phone]

## 2018-07-20 NOTE — Plan of Care (Signed)
Problem: Mood-related symptoms  Description  Pt expressed SI related to worsening depression   Goal: Reduction in Intensity and Frequency (Mood)  Description  Patient will be able to express her feelings to staff and identify appropriate coping skills   07/20/2018 1522 by Karl Luke, RN  Note:   Discharge note,  Pt has been visible on the unit. Pt has been pleasant, social and cooperative. Pt met with treatment team and agreed to be discharged home today. Pt did acknowledge receipt of all her belongings. Discharge teaching and after care appointments and medication instructions explained to patient and her family and she did voice understanding. Pt and family were made aware to go to [facility] in [place] to fill her prescriptions. Pt endorses feeling safe and ready for discharge. Crisis plan reviewed with pt. Pt was discharged at 1520 pm accompanied by her family.  07/20/2018 1459 by Karl Luke, RN  Note:   Pt has been visible on the unit. Pt adherent to meals and medications. Pt met with treatment team and agreed to be discharged home today. Pt endorses feeling better and ready for discharge. Discharge teaching and instructions done with pt. Pt did acknowledge receipt of all her belongings. Pt was made aware to go pick up her prescribtions from [facility] in [place] or [place].Crisis plan reviewed with pt. Pt was picked up by her mother and step dad.

## 2018-07-20 NOTE — Progress Notes (Signed)
Patient was discharged home today.  His case was reviewed in treatment team this morning.  He was A&O and receptive to teaching regarding his aftercare follow-up and meds.  He was in good behavioral control, not a threat to self or others, and was discharged as planned.

## 2018-07-26 ENCOUNTER — Ambulatory Visit (HOSPITAL_BASED_OUTPATIENT_CLINIC_OR_DEPARTMENT_OTHER): Payer: Self-pay | Admitting: PS Transition

## 2018-07-29 ENCOUNTER — Encounter (HOSPITAL_BASED_OUTPATIENT_CLINIC_OR_DEPARTMENT_OTHER): Payer: Self-pay | Admitting: Family Medicine

## 2018-08-09 ENCOUNTER — Ambulatory Visit: Payer: No Typology Code available for payment source | Attending: Psychiatry | Admitting: PS Transition

## 2018-08-09 DIAGNOSIS — F4310 Post-traumatic stress disorder, unspecified: Secondary | ICD-10-CM | POA: Diagnosis not present

## 2018-08-09 DIAGNOSIS — F4323 Adjustment disorder with mixed anxiety and depressed mood: Secondary | ICD-10-CM | POA: Insufficient documentation

## 2018-08-09 MED ORDER — FLUOXETINE HCL 20 MG PO CAPS: 20 mg | capsule | Freq: Every day | ORAL | 0 refills | 0 days | Status: DC

## 2018-08-09 MED ORDER — OLANZAPINE 5 MG PO TBDP: 5 mg | tablet | Freq: Every evening | ORAL | 0 refills | 0 days | Status: DC | PRN

## 2018-08-09 MED ORDER — MELATONIN 3 MG PO TABS: 3 mg | tablet | Freq: Every evening | ORAL | 0 refills | 0 days | Status: AC

## 2018-08-09 MED ORDER — CLONAZEPAM 1 MG PO TABS: 1 mg | tablet | Freq: Two times a day (BID) | ORAL | 0 refills | 0 days | Status: DC

## 2018-08-09 MED ORDER — FLUOXETINE HCL 20 MG PO CAPS
20.0000 mg | ORAL_CAPSULE | Freq: Every day | ORAL | 0 refills | Status: DC
Start: 2018-08-09 — End: 2018-09-09

## 2018-08-09 MED ORDER — OLANZAPINE 5 MG PO TBDP
5.0000 mg | ORAL_TABLET | Freq: Every evening | ORAL | 0 refills | Status: DC | PRN
Start: 2018-08-09 — End: 2018-09-09

## 2018-08-09 MED ORDER — MELATONIN 3 MG PO TABS
3.00 mg | ORAL_TABLET | Freq: Every evening | ORAL | 0 refills | Status: AC
Start: 2018-08-09 — End: 2018-09-08

## 2018-08-09 MED ORDER — CLONAZEPAM 1 MG PO TABS
1.0000 mg | ORAL_TABLET | Freq: Two times a day (BID) | ORAL | 0 refills | Status: DC
Start: 2018-08-09 — End: 2018-09-09
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
## 2018-08-10 ENCOUNTER — Other Ambulatory Visit (HOSPITAL_BASED_OUTPATIENT_CLINIC_OR_DEPARTMENT_OTHER): Payer: Self-pay

## 2018-08-10 NOTE — Progress Notes (Signed)
Patient Name: Danielle James  Preferred Phone Number: [phone]      Providers involved with care (Care Team):  Patient Care Team:  Daron Offer, MD as PCP - General (Family Medicine)  None    Non-providers involved with patient:  [place]  Reason for Referral and Resolution of Issues:  Misc  Referral resolution included:  Other case management: made list of community health centers near client's home       Summary of Actions Taken to Resolve Issues:   Fidel Levy referred this client. I called Judson Roch on the phone for details, as she suggested. She asked for a list of  community health centers in the area where this client lives, [place]. Here is the list:    [facility]  [id number]  Phone: [phone], closes at 5 pm    [facility]  [address]  Phone: [phone], closes at 4 pm    [facility]  [address]  Phone: [phone], closes at 8:30 pm.    I was unable to verify whether [facility] would be accepted, because I was told that currently, her health insurance member ID numbers listed in [place] do not show up in their system.     They told me that Shahira has to make sure her two insurances are set up and active, or else she can be seen, but will need to pay.    She can  call the [facility] department of financial assistance for help. Their number is [phone].     I assume the same goes for the other two health centers, but I did not check it out, because only she herself can call the insurance on this matter.    Sarah, I hope I provided what you need. If you need more, please let me know. Thank you.    Lillee Mooneyhan Von Balde[place], 08/10/2018

## 2018-08-17 MED FILL — MELATONIN 3MG: 30 days supply | Qty: 30 | Fill #0 | Status: CP

## 2018-08-17 MED FILL — FLUOXETINE   20MG: 30 days supply | Qty: 30 | Fill #0 | Status: CP

## 2018-08-17 MED FILL — CLONAZEPAM  1MG: 30 days supply | Qty: 60 | Fill #0 | Status: CP

## 2018-08-17 MED FILL — OLANZAPINE 5MG ODT: 30 days supply | Qty: 30 | Fill #0 | Status: CP

## 2018-08-23 ENCOUNTER — Ambulatory Visit: Payer: No Typology Code available for payment source | Attending: PS Transition | Admitting: PS Transition

## 2018-08-23 DIAGNOSIS — F322 Major depressive disorder, single episode, severe without psychotic features: Secondary | ICD-10-CM | POA: Insufficient documentation

## 2018-08-23 DIAGNOSIS — F4310 Post-traumatic stress disorder, unspecified: Secondary | ICD-10-CM | POA: Diagnosis present
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
## 2018-09-09 ENCOUNTER — Ambulatory Visit: Payer: No Typology Code available for payment source | Attending: Psychiatry | Admitting: PS Transition

## 2018-09-09 ENCOUNTER — Ambulatory Visit: Payer: No Typology Code available for payment source | Attending: Psychiatry

## 2018-09-09 DIAGNOSIS — Z79899 Other long term (current) drug therapy: Secondary | ICD-10-CM | POA: Insufficient documentation

## 2018-09-09 DIAGNOSIS — F419 Anxiety disorder, unspecified: Secondary | ICD-10-CM | POA: Diagnosis not present

## 2018-09-09 DIAGNOSIS — F322 Major depressive disorder, single episode, severe without psychotic features: Secondary | ICD-10-CM | POA: Diagnosis not present

## 2018-09-09 DIAGNOSIS — F4310 Post-traumatic stress disorder, unspecified: Secondary | ICD-10-CM | POA: Insufficient documentation

## 2018-09-09 LAB — LIPID PANEL
Cholesterol: 182 mg/dL (ref 0–239)
HIGH DENSITY LIPOPROTEIN: 67 mg/dL (ref 40–?)
LOW DENSITY LIPOPROTEIN DIRECT: 103 mg/dL (ref 0–189)
TRIGLYCERIDES: 100 mg/dL (ref 0–150)

## 2018-09-09 MED ORDER — CLONAZEPAM 1 MG PO TABS
1.0000 mg | ORAL_TABLET | Freq: Two times a day (BID) | ORAL | 0 refills | Status: DC
Start: 2018-09-09 — End: 2018-09-30

## 2018-09-09 MED ORDER — OLANZAPINE 5 MG PO TABS: 5 mg | tablet | Freq: Every evening | ORAL | 1 refills | 0 days | Status: DC

## 2018-09-09 MED ORDER — PAROXETINE HCL 10 MG PO TABS
ORAL_TABLET | ORAL | 0 refills | Status: DC
Start: 2018-09-09 — End: 2018-09-30

## 2018-09-09 MED ORDER — PAROXETINE HCL 10 MG PO TABS: tablet | ORAL | 0 refills | 0 days | Status: DC

## 2018-09-09 MED ORDER — CLONAZEPAM 1 MG PO TABS: 1 mg | tablet | Freq: Two times a day (BID) | ORAL | 0 refills | 0 days | Status: DC

## 2018-09-09 MED ORDER — OLANZAPINE 5 MG PO TABS
5.0000 mg | ORAL_TABLET | Freq: Every evening | ORAL | 1 refills | Status: DC
Start: 2018-09-09 — End: 2018-09-30

## 2018-09-09 MED FILL — OLANZAPINE 5MG: 30 days supply | Qty: 30 | Fill #0 | Status: CP

## 2018-09-09 MED FILL — PAROXETINE 10MG: 30 days supply | Qty: 120 | Fill #0 | Status: CP
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
## 2018-09-15 ENCOUNTER — Other Ambulatory Visit (HOSPITAL_BASED_OUTPATIENT_CLINIC_OR_DEPARTMENT_OTHER): Payer: Self-pay

## 2018-09-15 NOTE — Progress Notes (Signed)
Thank you for your recent referral. A message was left for the patient, asking them to call [facility] back at [phone] to schedule an appointment with us.

## 2018-09-16 LAB — HEMOGLOBIN A1C
ESTIMATED AVERAGE GLUCOSE: 100 (ref 74–160)
HEMOGLOBIN A1C: 5.1 % (ref 4.0–5.6)

## 2018-09-16 MED FILL — CLONAZEPAM  1MG: 30 days supply | Qty: 60 | Fill #0 | Status: CP

## 2018-09-17 ENCOUNTER — Other Ambulatory Visit (HOSPITAL_BASED_OUTPATIENT_CLINIC_OR_DEPARTMENT_OTHER): Payer: Self-pay

## 2018-09-17 NOTE — Progress Notes (Signed)
Thank you for your referral. We were able to contact your patient. He/She was given an appointment for therapy with Adriana Ansin on 10/15/2018 at [facility].

## 2018-09-30 ENCOUNTER — Ambulatory Visit: Payer: No Typology Code available for payment source | Attending: Psychiatry

## 2018-09-30 ENCOUNTER — Ambulatory Visit: Payer: No Typology Code available for payment source | Attending: Psychiatry | Admitting: PS Transition

## 2018-09-30 DIAGNOSIS — F4310 Post-traumatic stress disorder, unspecified: Secondary | ICD-10-CM | POA: Diagnosis present

## 2018-09-30 DIAGNOSIS — F322 Major depressive disorder, single episode, severe without psychotic features: Secondary | ICD-10-CM | POA: Diagnosis not present

## 2018-09-30 MED ORDER — CLONAZEPAM 0.5 MG PO TABS
1.00 mg | ORAL_TABLET | Freq: Two times a day (BID) | ORAL | 0 refills | Status: AC | PRN
Start: 2018-09-30 — End: 2018-10-30

## 2018-09-30 MED ORDER — OLANZAPINE 5 MG PO TABS: 5 mg | tablet | Freq: Every evening | ORAL | 0 refills | 0 days | Status: AC

## 2018-09-30 MED ORDER — PAROXETINE HCL 40 MG PO TABS
40.00 mg | ORAL_TABLET | Freq: Every morning | ORAL | 0 refills | Status: AC
Start: 2018-09-30 — End: 2018-12-29

## 2018-09-30 MED ORDER — PAROXETINE HCL 40 MG PO TABS: 40 mg | tablet | Freq: Every morning | ORAL | 0 refills | 0 days | Status: AC

## 2018-09-30 MED ORDER — OLANZAPINE 5 MG PO TABS
5.00 mg | ORAL_TABLET | Freq: Every evening | ORAL | 0 refills | Status: AC
Start: 2018-09-30 — End: 2018-12-29

## 2018-09-30 MED ORDER — CLONAZEPAM 0.5 MG PO TABS: 1 mg | tablet | Freq: Two times a day (BID) | ORAL | 0 refills | 0 days | Status: AC | PRN

## 2018-09-30 MED FILL — PAROXETINE 40MG: 30 days supply | Qty: 30 | Fill #0
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
## 2018-09-30 NOTE — Progress Notes (Signed)
Department of Psychiatry   OUTPATIENT PROGRESS NOTE    Visit Type:  Psychopharmacology      Problems which this visit addressed:   Problem 1: Anxiety/Depression   Problem 2: coordination of care    Source(s) of information:  Patient, Mother, Chart    Visit Number: 2    SUBJECTIVE:   Patient attends appointment with her mother. They are both in good spirits. Patient reports that she is doing much better. She reports that she is in a better mood, talking more, going out more, eating better, and fidgeting less. No longer having mood swings, feels more emotionally stable. Reports that she is sleeping well from ~11p to 7:30a, and that overall her energy is good during the day. Does feel tired at times, but rarely naps during the day. Able to do chores around the house. Has been eating more, but now may be eating too much. Has noticed increase in appetite since starting the zyprexa, eating more junk food. Had been jogging with mother, but stopped with season change. Feels confident that she can implement lifestyle adjustments for diet and exercise. Advised that this could be a side effect of the zyprexa, and that it may need to be decreased or discontinued in the future. Denies any episodes of SI since last appointment. Denies panic attacks.    Very pleased with the medications. Mother confirms that patient has been much improved at home as well.     Will be returning to [place] at the end of the month. Would like to have some medications to bridge her until she can meet with her psychiatrist Dr. Dub Mikes. She has seen this psychiatrist before and trusts him. Nervous about the change, as she will be away from her mother which will be difficult. However, fiance is in [place], as well as her biological family and his family. She plans to spend time with family if she begins to feel lonely, and plans to eat most meals at extended family's home.    Dr Dub Mikes office phone number is   Patient's mother's cell # [phone].                  OBJECTIVE:   Mental Status Exam  General Appearance: Casually dressed, in no acute distress, good hygeine, joined by mother  Behavior: Polite, less fidgeting, less anxious appearing. Mother supportive but does not speak [place].   Level of Consciousness: Alert   Attention/Concentration: Intact.   Mannerisms/Movements: No abnormal movements noted.  Speech Rate: WNL   Speech Clarity: Clear   Speech Tone: WNL  Vocabulary: WNL   Memory: Grossly intact   Thought Process: Linear, logical, Goal oriented  Thought Content: Denies any SI/HI/AH/VH  Mood: "Much better"  Affect: Euthymic, bright at times, no crying.  Judgment: Fair   Insight: Fair    Testing results (e.g., PHQ-9, BDI, vitals, weight):    PHQ-9 TOTAL SCORE 07/19/2018   Doc FlowSheet Total Score 27       PMP SEARCH FROM TODAY:  No new prescribers.     CURRENT MEDICATIONS:    Current Outpatient Medications   Medication Sig    PARoxetine (PAXIL) 10 MG tablet Take 2 tablets by mouth every morning for 7 days, THEN 3 tablets every morning for 7 days, THEN 4 tablets every morning.    OLANZapine (ZYPREXA) 5 MG tablet Take 1 tablet by mouth nightly    clonazePAM (KLONOPIN) 1 MG tablet Take 1 tablet by mouth 2 (two) times daily  No current facility-administered medications for this visit.        MEDICATION ADHERENCE (including barriers and how addressed):Yes    MEDICATION SIDE EFFECTS (Prescribers Only): Yes, increased appetite    BIRTH CONTROL (ask females and males): N/A    CURRENT PREGNANCY: No       BIO/PSYCHO/SOCIAL AND RISK FORMULATION(S):  This is a 27 year old [place] female, in [place] on visa, living with her mother, currently unemployed, with previous diagnosis of ptsd and panic attacks, recently hospitalized for the first time on [facility] for increased anxiety and suicidal ideation, no history of attempts, significant history of trauma in childhood and adolescence. Clinical presentation most consistent with mdd, gad, and ptsd.  Biologically, she has a family history including mother and maternal aunt who take benzodiazepines for anxiety. She reports mood symptoms beginning in early adolescence, but had not been on any long standing psych medications. She reports good response to the medications at this time, though remains quite symptomatic. Psychologically, she is extremely lonely, in foreign country, away from fiance and friends, yet can sense that she is coming across as needy. Socially, Picture is also very complicated. In [place], her father murdered her mothers boyfriend, and now the patient is in [place] with her mother. She reports planning to return to [place] in December 2019, when her visa expires, while her mother plans to stay here. This separation will be difficult for the mother and daughter. Patient is currently unemployed due to being immobilizedwith anxiety, although patient was quite high functioning even a few years ago, and had been accepted into law school in [place].   At her PTS pharm eval, patients relationship with her mother and her religion seem to be largest protective factors. She continued to endorse infrequent SI that she manages by occupying herself, praying, and/or finding someone to be near. she did not appear to be at elevated risk of harm to self and is appropriate for outpatient management.   At her follow up appointment patient reports significant improvement in symptoms, and desire to continue on this medication regimen. Patient planning to move back to [place] at the end of this month. She is excited to return home, and will be living in a home with fiance and near extended family.     DIAGNOSES ASSESSED TODAY (psychiatric diagnoses and medical diagnoses that factor into management of psychiatric treatment):     Primary Diagnosis: PTSD  Secondary Diagnosis: Generalized Anxiety Disorder, MDD  Medical Conditions: None  Psychosocial Stressors: Unemployed, socially isolated as recent immigrant to this  country from [place]    RISK ASSESSMENT (per scale):  Suicide: 1.5 (no history of attempts, but recently hospitalized for SI. Denies active SI.)  Violence: 1  Addiction: 1    PLAN:   Psychopharmacology:  - cont paxil 40mg    - continue zyprexa 5 qhs prn (pt reports taking nightly)  -continue klonopin 1 BID prn    Dispo:  -spoke with patient on the telephone to explain the prescriptions that have been sent to the pharmacy. I have written for 90 day supplies of paxil and zyprexa, and 30 day supply of prn bid klonopin.   - I have received written and verbal permission from patient to speak with her psychiatrist to coordinate care. Dr Dub Mikes # : work: [phone], personal ( for emergency): [phone]. I plan to call psychiatrist this week.    Instructions to covering providers:  - Patient should not be requesting refills.     Safety:   -  Patient given my card with VM and # to PES. No evidence of imminent risk of harm to self or others at this time.    Follow-up:   - With psychiatrist in [place].  --------    ONLY FOR PRESCRIBERS DOING EVALUATION AND MANAGEMENT VISITS     Use only when no psychotherapy performed  COUNSELING AND COORDINATION OF CARE PROVIDED (Consider diagnostic results/impressions and/or recommended studies; risks and benefits of treatment options; instruction for management/treatment and/or follow-up; importance of compliance with chosen treatment options; risk factor reduction; patient/family/caregiver education; prognosis): Yes. Psychoeducation, and med eval.                          Over 50% of time during today's visit was devoted to counseling and/or coordination of care.  If yes, record estimated duration of the face to face encounter.  Yes. Estimated Time: 21    Patient case was reviewed with Dr Luevenia Maxin, PGY-4, who is supervised by Dr. Duanne Limerick.    Lady Saucier, MD  Psychiatry Resident PGY-3  Pager [phone]  VM: [phone]

## 2018-09-30 NOTE — Patient Instructions (Signed)
-  If you decide to stop taking paxil, you should decrease slowly! Decrease by 10mg  per week. IF you do this please call me or Dr. Dub Mikes.

## 2018-10-08 ENCOUNTER — Telehealth (HOSPITAL_BASED_OUTPATIENT_CLINIC_OR_DEPARTMENT_OTHER): Payer: Self-pay

## 2018-10-08 MED FILL — PAROXETINE 40MG: 30 days supply | Qty: 30 | Fill #0

## 2018-10-08 MED FILL — CLONAZEPAM  0.5MG: 30 days supply | Qty: 120 | Fill #0

## 2018-10-08 MED FILL — OLANZAPINE 5MG: 30 days supply | Qty: 30 | Fill #0

## 2018-10-08 NOTE — Progress Notes (Signed)
Received message from patient that she had not received call from the pharmacy yet, and wondering if the medications were ready. I called the pharmacy and confirmed that the medications were ready and that the total copay would be about $30. I left patient a voicemail saying that the medications were available at [facility]. Also responded to patients concern that her anxiety was having some spikes as her return to [place] near, and her request to increase Klonopin to TID. I replied that increased the objective amount of klonopin was not the safest idea at this time due to long term risk of the medications, but that patient could consider splitting her existing dose to nicrease the frequency; also thought it would be reasonable to increase the paxil. Emphasized the importance of making appointment ASAP with with psychiatrist in [place], if not already made to have close follow up with this matter.

## 2018-10-14 ENCOUNTER — Other Ambulatory Visit (HOSPITAL_BASED_OUTPATIENT_CLINIC_OR_DEPARTMENT_OTHER): Payer: Self-pay

## 2018-10-14 NOTE — Progress Notes (Signed)
Thank you for your recent referral. We tried to reach your patient a second time, and were unsuccessful. Please encourage your patient to call [phone] if they're still interested in treatment, as we are unable to reach out to them again. The referral that you placed remains active for a year.

## 2018-10-15 ENCOUNTER — Ambulatory Visit (HOSPITAL_BASED_OUTPATIENT_CLINIC_OR_DEPARTMENT_OTHER): Payer: Self-pay

## 2019-02-22 ENCOUNTER — Telehealth (HOSPITAL_BASED_OUTPATIENT_CLINIC_OR_DEPARTMENT_OTHER): Payer: Self-pay | Admitting: Health Educator

## 2019-02-22 NOTE — Progress Notes (Signed)
Outreach call for depression   Patient not reached  Left vm and asked for a call back  Needs repeat PHQ-9 and appt with OPD    Fara Boros, [facility] Partner, 02/22/2019,11:32 AM

## 2019-03-01 ENCOUNTER — Encounter (HOSPITAL_BASED_OUTPATIENT_CLINIC_OR_DEPARTMENT_OTHER): Payer: Self-pay | Admitting: Health Educator

## 2019-08-25 ENCOUNTER — Encounter (HOSPITAL_BASED_OUTPATIENT_CLINIC_OR_DEPARTMENT_OTHER): Payer: Self-pay | Admitting: PS Transition
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
## 2020-09-10 ENCOUNTER — Ambulatory Visit: Payer: No Typology Code available for payment source | Attending: Family Medicine | Admitting: Family Medicine

## 2020-09-10 DIAGNOSIS — L853 Xerosis cutis: Secondary | ICD-10-CM | POA: Diagnosis present

## 2020-09-10 DIAGNOSIS — R1013 Epigastric pain: Secondary | ICD-10-CM

## 2020-09-10 MED ORDER — FAMOTIDINE 20 MG PO TABS
20.0000 mg | ORAL_TABLET | Freq: Two times a day (BID) | ORAL | 0 refills | Status: DC
Start: 2020-09-10 — End: 2020-10-10

## 2020-09-10 MED FILL — FAMOTIDINE 20MG: 30 days supply | Qty: 60 | Fill #0 | Status: CP

## 2020-09-10 NOTE — Progress Notes (Signed)
[facility]  Telehealth Visit Note   Subjective:   CC: Danielle James is a 29 year old female patient who presents today for telehealth visit for Patient presents with:  Gastrointestinal Illness      Phone number: [phone]     #Gastritis  Dx in [place]- sx for about 1 month  Given PPI (pantoprazole 40mg ) and told to do diet - more vegetables and avoid greasy food and soda  Pt has been doing this- Continuing to feel sick  Nausea, cramping, some general pain- mostly top of belly  Cramping worse in last 3 days  Vomiting sometimes  Symptoms are the same all day  Negative testing H pylori and some other things but it was negative  No diarrhea  No other sick contacts  Never had sx like this before     #Rash-  Started a couple days ago  Happens after bath  Using more moisturizer and it helped a little  Still itchy  Mostly legs and arms  Does become red after scratching  Using a different soap- Olay      SH/PMH:  Travel to [place] and [place] but was already not feeling well in [place]   No hx of abdominal surgery  LMP- end of August/Sept - few days less than normal    ROS:  Per HPI     OLANZapine (ZYPREXA) 5 MG tablet, Take 1 tablet by mouth nightly, Disp: 90 tablet, Rfl: 0  PARoxetine (PAXIL) 40 MG tablet, Take 1 tablet by mouth every morning, Disp: 90 tablet, Rfl: 0  clonazePAM (KLONOPIN) 0.5 MG tablet, Take 2 tablets by mouth 2 (two) times daily as needed for Anxiety, Disp: 120 tablet, Rfl: 0    No current facility-administered medications on file prior to visit.    Objective:     Exam - vitals deferred in setting of COVID pandemic  Speaking in complete sentences, breathing comfortably  Able to concentrate throughout visit    Assessment & Plan:     1. Epigastric pain  Unclear etiology though sx consistent with gastritis. Neg H pylori per patient. Unclear other etiology.  No loose stools so unlikely infectious gastroenteritis or IBD. Will check labs for liver, gallbladder etiology and some infections. Will  switch to H2 blocker so can check for H pylori in 4 weeks  - famotidine (PEPCID) 20 MG tablet; Take 1 tablet by mouth 2 (two) times daily  Dispense: 60 tablet; Refill: 0  - COLLECTION VENOUS BLOOD VENIPUNCTURE; Future  - CBC WITH PLATELET; Future  - COMPREHENSIVE METABOLIC PANEL; Future  - STRONGYLOIDES AB IGG; Future  - SCHISTOSOMAL AB IGG; Future  - HELICOBACTER PYLORI STOOL AG; Future  - Pt to come in for in person appt for abdominal exam    2. Dry skin dermatitis  Possibly from new soap. Will check skin at in person appt this week    We discussed the patients current medications. The patient expressed understanding and no barriers to adherence were identified.  Fredirick Lathe, MD 09/10/2020

## 2020-09-12 ENCOUNTER — Other Ambulatory Visit: Payer: Self-pay

## 2020-09-12 ENCOUNTER — Ambulatory Visit: Payer: No Typology Code available for payment source | Attending: Family Medicine | Admitting: Family Medicine

## 2020-09-12 VITALS — BP 118/60 | HR 97 | Temp 96.8°F | Wt 129.0 lb

## 2020-09-12 DIAGNOSIS — Z3009 Encounter for other general counseling and advice on contraception: Secondary | ICD-10-CM | POA: Diagnosis present

## 2020-09-12 DIAGNOSIS — Z23 Encounter for immunization: Secondary | ICD-10-CM | POA: Diagnosis present

## 2020-09-12 DIAGNOSIS — R110 Nausea: Secondary | ICD-10-CM

## 2020-09-12 DIAGNOSIS — R1013 Epigastric pain: Secondary | ICD-10-CM | POA: Diagnosis present

## 2020-09-12 LAB — POC URINALYSIS
BILIRUBIN, URINE: NEGATIVE
GLUCOSE,URINE: NEGATIVE
KETONE, URINE: NEGATIVE
LEUKOCYTE ESTERASE: NEGATIVE
NITRITE, URINE: NEGATIVE
OCCULT BLOOD, URINE: NEGATIVE
PH URINE: 5.5 (ref 5.0–8.0)
PROTEIN, URINE: NEGATIVE
SPECIFIC GRAVITY, URINE: 1.02 (ref 1.003–1.030)
UROBILINOGEN URINE: 0.2 (ref 0.2–1.0)

## 2020-09-12 LAB — CBC WITH PLATELET
ABSOLUTE NRBC COUNT: 0 10*3/uL (ref 0.0–0.0)
HEMATOCRIT: 37.5 % (ref 34.1–44.9)
HEMOGLOBIN: 12.6 g/dL (ref 11.2–15.7)
MEAN CORP HGB CONC: 33.6 g/dL (ref 31.0–37.0)
MEAN CORPUSCULAR HGB: 30.1 pg (ref 26.0–34.0)
MEAN CORPUSCULAR VOL: 89.5 fl (ref 80.0–100.0)
MEAN PLATELET VOLUME: 10.6 fL (ref 8.7–12.5)
NRBC %: 0 % (ref 0.0–0.0)
PLATELET COUNT: 371 10*3/uL (ref 150–400)
RBC DISTRIBUTION WIDTH STD DEV: 42.1 fL (ref 35.1–46.3)
RED BLOOD CELL COUNT: 4.19 M/uL (ref 3.90–5.20)
WHITE BLOOD CELL COUNT: 9.1 10*3/uL (ref 4.0–11.0)

## 2020-09-12 LAB — URINE PREGNANCY TEST (POINT OF CARE): HCG QUALITATIVE URINE: NEGATIVE

## 2020-09-12 NOTE — Progress Notes (Signed)
Influenza Vaccine Procedure  September 12, 2020    1. Has the patient received the information for the influenza vaccine? Yes    2. Does the patient have any of the following contraindications?  Allergy to eggs? No  Allergic reaction to previous influenza vaccines? No  Any other problems to previous influenza vaccines? No  Paralyzed by Guillain-Barre syndrome?  No  Current moderate or severe illness? No  Allergy to contact lens solution? No    3. The vaccine has been administered in the usual fashion.     Immunization information reviewed. Current VIS reviewed and given to patient/ guardian. Verbal assent obtained from patient/ guardian.  See immunization/Injection module or chart review for date of publication and additional information. Verbal assent obtained from patient/guardian. Comfort measures for possible side effects reviewed.

## 2020-09-12 NOTE — Progress Notes (Addendum)
[facility]  MS3 Office Visit Note    Subjective:   CC: Danielle James is a 29 year old female who presents for the following issues:    Patient accompanied by father.    # Nausea  - pt was in [place] when she went to [facility] for abd pain; diagnosed w/ gastritis after endoscopy  - was given omeprazole w/ minimal relief  - abd pain resolved, but continues to have constant nausea and vomiting, esp in resp to smelling greasy/heavy foods   - sometimes wakes up with abd cramps at night  - no fevers/chills, diarrhea, blood in stool, urinary sxs, or wt loss  - sexually active w/ her husband  - takes OCP, needs a refill   - her period is a few days late; LMP end of August  - home pregnancy test was negative  - interested in other forms of birth control that won't make her gain wt    # Acid reflux  - started taking pepcid 2d ago, helped a lot   - not much acid reflux anymore       ROS  Reviewed pertinent positives and negatives, as per HPI above.    MEDS  Patient's medications were reviewed and updated in the chart.    Current Outpatient Medications:     famotidine (PEPCID) 20 MG tablet, Take 1 tablet by mouth 2 (two) times daily, Disp: 60 tablet, Rfl: 0    OLANZapine (ZYPREXA) 5 MG tablet, Take 1 tablet by mouth nightly, Disp: 90 tablet, Rfl: 0    PARoxetine (PAXIL) 40 MG tablet, Take 1 tablet by mouth every morning, Disp: 90 tablet, Rfl: 0    clonazePAM (KLONOPIN) 0.5 MG tablet, Take 2 tablets by mouth 2 (two) times daily as needed for Anxiety, Disp: 120 tablet, Rfl: 0  Review of Patient's Allergies indicates:  No Known Allergies    Objective:   Physical Exam  BP 118/60    Pulse 97    Temp 96.8 F (36 C) (Temporal)    Wt 58.5 kg (129 lb)    SpO2 99%    BMI 24.67 kg/m   General: Healthy-appearing woman, in no acute distress, answers questions appropriately, cooperative with exam.  HEENT: NCAT. Clear conjunctiva, no scleral icterus. EOMI.  Lungs: Non-labored respirations. CTAB, no W/R/C.   Heart: RRR,  +S1/S2, no M/R/G.  Abdomen: +BS, soft, epigastric tenderness to palpation, non-distended, no guarding or rebound, no organomegaly or masses.  Skin: Warm, dry, no rashes or lesions noted.  Neuro: AAO x 4. No focal deficits.       Assessment & Plan:   Danielle James is a 29 year old female who presents to the clinic for:    1. Epigastric pain  Abd pain has resolved since getting back to the [place] but epigastric pain w/ palpation noted on exam. Associated nausea and vomiting for the past month. Especially triggered by smell of greasy/heavy foods. Urine pregnancy test and UA negative. Concerned for schistosoma or strongyloides, iso recent travel. Residual gastritis is also on the differential.   - SCHISTOSOMAL AB IGG  - STRONGYLOIDES AB IGG  - COMPREHENSIVE METABOLIC PANEL  - CBC WITH PLATELET  - COLLECTION VENOUS BLOOD VENIPUNCTURE  - continue pepcid  - return to clinic if no improvement of sxs in 2-3 weeks  - repeat pregn test if no period in the next week    2. Need for prophylactic vaccination and inoculation against influenza  - IMMUNIZATION ADMIN SINGLE  -  IIV4 VACC PRESERV FREE AGE 72 MONTHS AND OLDER, 0.5ML, IM      Next time:   [ ]  med check - ?metformin  [ ]  f/u labs      --  Discussed with Dr. Marissa Calamity.    - The patient indicates understanding of these issues and agrees with the plan. No barriers to adherence were identified.  - The patient was given an After Visit Summary with a list of their current medications.  - I have reviewed the past medical, surgical, social, and family history, and updated these sections, as relevant.  - All interim labs, test results, and consult notes were reviewed and discussed with Danielle James  - Medications were reconciled and refills were addressed.    Elwin Mocha, MS3  09/12/2020 3:50 PM

## 2020-09-12 NOTE — Progress Notes (Signed)
Informed in visit

## 2020-09-17 LAB — COMPREHENSIVE METABOLIC PANEL
ALANINE AMINOTRANSFERASE: 19 U/L (ref 12–45)
ALBUMIN: 2.9 g/dL — ABNORMAL LOW (ref 3.4–5.0)
ALKALINE PHOSPHATASE: 81 U/L (ref 45–117)
ANION GAP: 7 mmol/L (ref 5–15)
ASPARTATE AMINOTRANSFERASE: 10 U/L (ref 8–34)
BILIRUBIN TOTAL: 0.4 mg/dL (ref 0.2–1.0)
BUN (UREA NITROGEN): 8 mg/dL (ref 7–18)
CALCIUM: 8.9 mg/dL (ref 8.5–10.1)
CARBON DIOXIDE: 27 mmol/L (ref 21–32)
CHLORIDE: 102 mmol/L (ref 98–107)
CREATININE: 0.7 mg/dL (ref 0.4–1.2)
ESTIMATED GLOMERULAR FILT RATE: >60 mL/min (ref >60)
Glucose Random: 104 mg/dL (ref 74–160)
POTASSIUM: 4.2 mmol/L (ref 3.5–5.1)
SODIUM: 136 mmol/L (ref 136–145)
TOTAL PROTEIN: 7.2 g/dL (ref 6.4–8.2)

## 2020-09-17 LAB — STRONGYLOIDES AB IGG: STRONGYLOIDES AB IgG: 2.87 INDEX (ref 0.00–9.99)

## 2020-09-17 LAB — SCHISTOSOMAL AB IGG: SCHISTOSOMAL AB IgG: 11.9 INDEX — ABNORMAL HIGH (ref 0.00–8.99)

## 2020-09-18 ENCOUNTER — Other Ambulatory Visit: Payer: Self-pay

## 2020-09-19 ENCOUNTER — Ambulatory Visit: Payer: No Typology Code available for payment source | Attending: Family Medicine | Admitting: Family Medicine

## 2020-09-19 ENCOUNTER — Encounter (HOSPITAL_BASED_OUTPATIENT_CLINIC_OR_DEPARTMENT_OTHER): Payer: Self-pay | Admitting: Family Medicine

## 2020-09-19 ENCOUNTER — Telehealth (HOSPITAL_BASED_OUTPATIENT_CLINIC_OR_DEPARTMENT_OTHER): Payer: Self-pay | Admitting: Family Medicine

## 2020-09-19 DIAGNOSIS — F4310 Post-traumatic stress disorder, unspecified: Secondary | ICD-10-CM | POA: Insufficient documentation

## 2020-09-19 DIAGNOSIS — B659 Schistosomiasis, unspecified: Secondary | ICD-10-CM | POA: Insufficient documentation

## 2020-09-19 DIAGNOSIS — F322 Major depressive disorder, single episode, severe without psychotic features: Secondary | ICD-10-CM

## 2020-09-19 NOTE — Progress Notes (Signed)
Pt informed by phone. See telephone encounter

## 2020-09-19 NOTE — Telephone Encounter (Signed)
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

## 2020-09-19 NOTE — Progress Notes (Signed)
SUBJECTIVE  Was an interpreter was used for this encounter?:  no    Danielle James is a 29 year old female with a history of depression/anxiety presenting via televisit for psych referral    Latuda 20mg   Clonazepam 0.25mg  daily  Famotidine    Does not take paroxetine/paxil, olanzapine    Has a history of anxiety  Going through a bad time right now  Not liking crowded spaces, is having anxiety attacks  Mom things she needs to speak with a doctor  She herself is feeling really depressed, not finding joy right now.  Currently away from home and husband is in [place]  Feels likes the medications she is on are helping, but not enough  Has been diagnosed with PTSD, no history of mania or psychosis      REVIEW OF SYSTEMS  Please see screeners    OBJECTIVE  No vitals taken as televisit due to COVID-19 pandemic  Pulm: no respiratory distress  Psych: mood and affect appropriate    PHQ9-23  GAD7-17      CIDI 3.0 BIPOLAR SCREENING:     Stem Questions (at least 1 required):     1.  Some people have periods lasting several days or longer when they feel much more excited and full or energy than usual. Their minds go too fast. They talk a lot. They are very restless or unable to sit still and they sometimes do things that are unusual for them, such as driving too fast or spending too much money. Have you ever had a period liked this lasting several days or longer?: No      2.  Have you ever had a period lasting several days or longer when most of the time you were so irritable or grouchy that you either started arguments, shouted at people, or hit people?: No     0-4 positive questions - Very low risk (less than 5%)      ASSESSMENT/PLAN  Danielle James is a 29 year old female here for  Problem List Items Addressed This Visit        Mental Health    MDD (major depressive disorder), severe ([place])    Relevant Orders    RFL TO ADULT OUTPT PSYCH (NEW) [facility] (Completed)    PTSD (post-traumatic stress disorder)    Relevant Orders     RFL TO ADULT OUTPT PSYCH (NEW) [facility] (Completed)        Patient presenting with worsening anxiety. Would like referral to [facility]. Discussed with patient that a referral may take some time. Will also send a letter with other options to find a therapist.     Discussed optimizing pharmacological treatment of her anxiety. Patient would like to hold off on making any medication changes due to ongoing stomach issues. Discussed trial of hydroxyazine (Atarax). Patient declines.    1. The patient indicates understanding of these issues and agrees with the plan.  2. I reviewed the patient's medical information , allergies and medical history   3. I reconciled the patient's medication list and prepared and supplied needed refills.  4.  I have reviewed the past medical, family, and social history sections including the medications and allergies listed in the above medical record    I spent a total of 30 minutes on this visit on the date of service (total time includes all activities performed on the date of service)    Rhunette Croft, MD

## 2020-09-27 ENCOUNTER — Ambulatory Visit: Payer: No Typology Code available for payment source | Attending: Family Medicine

## 2020-09-27 ENCOUNTER — Other Ambulatory Visit: Payer: Self-pay

## 2020-09-27 DIAGNOSIS — R1013 Epigastric pain: Secondary | ICD-10-CM | POA: Diagnosis present

## 2020-09-27 DIAGNOSIS — B659 Schistosomiasis, unspecified: Secondary | ICD-10-CM | POA: Insufficient documentation

## 2020-09-27 NOTE — Progress Notes (Signed)
Stool and Urine drop off  Danielle James, [place], 09/27/2020

## 2020-09-28 LAB — HELICOBACTER PYLORI STOOL AG

## 2020-09-28 LAB — OVA AND PARASITES
O&P CONCENTRATE: NONE SEEN
O&P TRICHROME: NONE SEEN

## 2020-09-28 LAB — SCHISTOSOME URINE

## 2020-10-02 ENCOUNTER — Other Ambulatory Visit (HOSPITAL_BASED_OUTPATIENT_CLINIC_OR_DEPARTMENT_OTHER): Payer: Self-pay | Admitting: Internal Medicine

## 2020-10-02 ENCOUNTER — Telehealth (HOSPITAL_BASED_OUTPATIENT_CLINIC_OR_DEPARTMENT_OTHER): Payer: Self-pay | Admitting: Family Medicine

## 2020-10-02 DIAGNOSIS — B659 Schistosomiasis, unspecified: Secondary | ICD-10-CM

## 2020-10-02 MED ORDER — PRAZIQUANTEL 600 MG PO TABS
20.00 mg/kg | ORAL_TABLET | Freq: Two times a day (BID) | ORAL | 0 refills | Status: AC
Start: 2020-10-02 — End: 2020-10-03

## 2020-10-02 MED FILL — BILTRICIDE  600MG: 1 days supply | Qty: 4 | Fill #0

## 2020-10-02 NOTE — e-consult (Signed)
Infectious diseases e-Consultation    Thank you for referring your patient for an e-consultation. I have not interviewed or examined the patient. My recommendations here are based on the review of relevant clinical information and information provided by the referring provider.    Consultation request:   Dr Marissa Calamity     Case Summary:  "29 year old with gastritis and postive schistosomal antibody. Had negative urine and stool testing for eggs. Symptoms have now resolved. Pt is not aware of previous treatment. Planned to treat with praziquental 40mg /kg/day in 2 divided doses since possibly infected in last 2 months which may explain testing result. Appreciate thoughts if treatment is reasonable given sx resolved and neg egg testing and if any follow up testing indicated. "      Assessment:   As above.  Unclear if gastritis is attributed to schistosomiasis as it is an unusual organ to be involved  (usual organinvolvement includes, liver, bladder, intestine, tubo-ovarian)  but agree with treatment plan for schistosomiasis as outlined       SCHISTOSOMAL AB IgG  SCHISTOSOMAL AB IGG  Collected: 09/12/20 1644   Result status: Final   Resulting lab: [facility]   Reference range: 0.00 - 8.99 INDEX   Value: 11.90High    Comment:  < = 8.99:  Negative   9.00 - 11.00:  Equivocal    > = 11.01:  Positive          Recommendations:  1. Praziquantel 40mg /kg in 2 divided doses as you are suggesting  2. Please consider HIV testing ( none in our system) and quantiferon order if the patient is foreign born    Please let me know whether I can be of further assistance and please let me know how the patient responds to your management.    Sincerely yours,    Kerry Hough, MD    Patient Disposition : NO FOLLOW UP VISIT NEEDED    Time spent on this e-Consultation :  11-20 minutes, >50% of the total time devoted to medical consultative verbal/ EMR discussion between the providers written and verbal report will  be generated

## 2020-10-02 NOTE — Telephone Encounter (Signed)
Called patient back with interpreter to discuss ID recommendations    Left message on Mobile number for patient to call back       [place] for RN to give information below    Take Praziquantel 2 tabs AM and 2 tabs PM for total of 2 doses (medication sent to pharmacy)    Potential side effects include dizziness, HA, malaise, itchy rash, nausea or abdominal discomfort.    Pt should schedule in person appt for CPEX with pap and can get other routine screening labs such as HIV, Hep C and Khristen Cheyney  [phone]  [place] [[id number]]    Please assist this patient with the following:    Schedule in-person visit for CPEX with pap, time frame: per patient, with Green team, if appointment not available: Schedule with alternate provider or at alternate location per patient preference

## 2020-10-02 NOTE — Progress Notes (Signed)
Pt informed by phone. See telephone encounter

## 2020-10-02 NOTE — Telephone Encounter (Signed)
Called with interpreter   Phone number: [phone]   to discuss negative urine and stool testing    For Schisto Couple options given + antibody and neg eggs  May be more recent infection in last 2-3 months so eggs so we can treat anyway  Or   Econsult with ID    Pt not aware of previous treatment for abx    Was in [place] for about 6 months and return to [place] on Sept 14  Gastritis (nausea/pain) started in [place]  Symptoms are a lot better now     Will send [employer]

## 2020-10-04 NOTE — Telephone Encounter (Signed)
Pt is informed to take Praziquantel as prescribed.     Take Praziquantel 2 tabs AM and 2 tabs PM for total of 2 doses (medication sent to pharmacy)    Potential side effects include dizziness, HA, malaise, itchy rash, nausea or abdominal discomfort.     Pt agrees with plan.

## 2020-10-05 ENCOUNTER — Telehealth (HOSPITAL_BASED_OUTPATIENT_CLINIC_OR_DEPARTMENT_OTHER): Payer: Self-pay

## 2020-10-05 NOTE — Telephone Encounter (Signed)
I called the patient and Left a voicemail to return call    If patient calls back, please: ... Fredirick Lathe, MD  [facility] Televisit Followup Pool        Fredirick Lathe, MD  [facility] Televisit Followup Pool  Shajuan Musso   [phone]   [place]      Please assist this patient with the following:      Schedule in-person visit for CPEX with pap, time frame: per patient, with Green team, if appointment not available: Schedule with alternate provider or at alternate location per patient preference

## 2020-10-10 ENCOUNTER — Other Ambulatory Visit (HOSPITAL_BASED_OUTPATIENT_CLINIC_OR_DEPARTMENT_OTHER): Payer: Self-pay | Admitting: Family Medicine

## 2020-10-10 DIAGNOSIS — R1013 Epigastric pain: Secondary | ICD-10-CM

## 2020-10-10 NOTE — Telephone Encounter (Signed)
PER Father, Danielle James is a 28 year old female has requested a refill of     -  FAMOTIDINE       Last Office Visit: 09/12/2020 with D'AGATA, C  Last Physical Exam: N/A    PAP SMEAR Never done    Other Med Adult:  Most Recent BP Reading(s)  09/12/20 : 118/60        Cholesterol (mg/dL)   Date Value   09/09/2018 182     LOW DENSITY LIPOPROTEIN DIRECT (mg/dL)   Date Value   09/09/2018 103     HIGH DENSITY LIPOPROTEIN (mg/dL)   Date Value   09/09/2018 67     TRIGLYCERIDES (mg/dL)   Date Value   09/09/2018 100         No results found for: TSHSC      No results found for: TSH    HEMOGLOBIN A1C (%)   Date Value   09/09/2018 5.1       No results found for: POCA1C      No results found for: INR    SODIUM (mmol/L)   Date Value   09/12/2020 136       POTASSIUM (mmol/L)   Date Value   09/12/2020 4.2           CREATININE (mg/dL)   Date Value   09/12/2020 0.7       Documented patient preferred pharmacies:    [facility], [place] - [address]  Phone: [phone] Fax: [phone]

## 2020-10-11 MED ORDER — FAMOTIDINE 20 MG PO TABS
20.0000 mg | ORAL_TABLET | Freq: Two times a day (BID) | ORAL | 0 refills | Status: DC
Start: 2020-10-11 — End: 2020-10-15

## 2020-10-11 MED FILL — FAMOTIDINE 20MG: 30 days supply | Qty: 60 | Fill #0

## 2020-10-15 ENCOUNTER — Ambulatory Visit: Payer: No Typology Code available for payment source | Attending: Family Medicine | Admitting: Family Medicine

## 2020-10-15 ENCOUNTER — Other Ambulatory Visit (HOSPITAL_BASED_OUTPATIENT_CLINIC_OR_DEPARTMENT_OTHER): Payer: Self-pay

## 2020-10-15 DIAGNOSIS — F322 Major depressive disorder, single episode, severe without psychotic features: Secondary | ICD-10-CM | POA: Diagnosis not present

## 2020-10-15 DIAGNOSIS — B659 Schistosomiasis, unspecified: Secondary | ICD-10-CM | POA: Diagnosis present

## 2020-10-15 DIAGNOSIS — F4310 Post-traumatic stress disorder, unspecified: Secondary | ICD-10-CM | POA: Insufficient documentation

## 2020-10-15 DIAGNOSIS — Z0000 Encounter for general adult medical examination without abnormal findings: Secondary | ICD-10-CM

## 2020-10-15 DIAGNOSIS — R1013 Epigastric pain: Secondary | ICD-10-CM | POA: Diagnosis present

## 2020-10-15 MED ORDER — LATUDA 20 MG PO TABS
20.0000 mg | ORAL_TABLET | Freq: Every evening | ORAL | 0 refills | Status: DC
Start: 2020-10-15 — End: 2020-11-29

## 2020-10-15 MED ORDER — OMEPRAZOLE 20 MG PO CPDR
20.00 mg | DELAYED_RELEASE_CAPSULE | Freq: Every day | ORAL | 0 refills | Status: AC
Start: 2020-10-15 — End: 2020-11-14

## 2020-10-15 MED FILL — OMEPRAZOLE 20MG: 30 days supply | Qty: 30 | Fill #0 | Status: CP

## 2020-10-15 NOTE — e-consult (Signed)
Psychiatry e-Consultation    Thank you for referring your patient for an e-consultation. I have not interviewed or examined the patient. My recommendations here are based on the review of relevant clinical information and information provided by the referring provider.    Consultation request:  "29 year old previously on 3 medications and now only on latuda since returning from [place]. Refilled for 1 month so pt does not run out and appreciate mediaction review and recommendation for monitoring until pt is able to get in with psychiatry (referral placed 09/19/2020). "    Primary CARE physician:  Fredirick Lathe, MD   [place]     Case Summary:  Please see Dr.D'Agata' s encounter associated with this E consult from 10/15/2020.  Patient is a 29 year old woman with a history of major depressive disorder and posttraumatic stress disorder.      PHQ-9 TOTAL SCORE 09/19/2020 07/19/2018   Doc FlowSheet Total Score 23 27         GAD-7 Total 09/19/2020 07/15/2018   GAD-7 Score 17 21           09/28/20 1144  OVA AND PARASITES   Collected: 09/27/20 1852   Final result   Specimen: Stool    O&P CONCENTRATE NO OVA AND PARASITES SEEN O&P TRICHROME NO OVA AND PARASITES SEEN          09/28/20 1143  SCHISTOSOME URINE   Collected: 09/27/20 1852   Final result   Specimen: Urine, Clean Void    SCHISTOSOME URINE No Schistosoma eggs present            84/13/24 4010  HELICOBACTER PYLORI STOOL AG   Collected: 09/27/20 1852   Final result   Specimen: Stool    HELICOBACTER PYLORI STOOL ANTIGEN NEGATIVE: (H. pylori antigens are absent or below the level   of detection.)             09/17/20 0748  COMPREHENSIVE METABOLIC PANEL   Collected: 09/12/20 1644   Final result    SODIUM 136 mmol/L TOTAL PROTEIN 7.2 g/dL   POTASSIUM 4.2 mmol/L ALBUMIN 2.9Low g/dL   CHLORIDE 102 mmol/L BILIRUBIN TOTAL 0.4 mg/dL   CARBON DIOXIDE 27 mmol/L ALKALINE PHOSPHATASE 81 U/L   ANION GAP 7 mmol/L ASPARTATE  AMINOTRANSFERASE 10 U/L   CALCIUM 8.9 mg/dL CREATININE 0.7 mg/dL   Glucose Random 104 mg/dL ESTIMATED GLOMERULAR FILT RATE > 60 ML/MIN    BUN (UREA NITROGEN) 8 mg/dL ALANINE AMINOTRANSFERASE 19 U/L          09/17/20 0748  STRONGYLOIDES AB IGG   Collected: 09/12/20 1644   Final result    STRONGYLOIDES AB IgG 2.87 INDEX             09/17/20 0748  SCHISTOSOMAL AB IGG   Collected: 09/12/20 1644   Final result    SCHISTOSOMAL AB IgG 11.90High INDEX             09/12/20 1947  CBC WITH PLATELET   Collected: 09/12/20 1644   Final result    WHITE BLOOD CELL COUNT 9.1 TH/uL MEAN CORP HGB CONC 33.6 g/dL   RED BLOOD CELL COUNT 4.19 M/uL RBC DISTRIBUTION WIDTH STD DEV 42.1 fL   HEMOGLOBIN 12.6 g/dL PLATELET COUNT 371 TH/uL   HEMATOCRIT 37.5 % MEAN PLATELET VOLUME 10.6 fL   MEAN CORPUSCULAR VOL 89.5 fl NRBC % 0.0 %   MEAN CORPUSCULAR HGB 30.1 pg ABSOLUTE NRBC COUNT 0.0 TH/uL  09/12/20 1631  URINE PREGNANCY TEST (POINT OF CARE)   Collected: 09/12/20 1631   Final result   Specimen: Urine, Clean Void    HCG QUALITATIVE URINE Negative  ONBOARD CONTROL PRESENT? Yes          09/12/20 1628  POC URINALYSIS   Collected: 09/12/20 1625   Final result    COLOR YELLOW UROBILINOGEN URINE 0.2   CLARITY CLEAR OCCULT BLOOD, URINE NEGATIVE   GLUCOSE,URINE NEGATIVE PH URINE 5.5   BILIRUBIN, URINE NEGATIVE PROTEIN, URINE NEGATIVE   KETONE, URINE NEGATIVE NITRITE, URINE NEGATIVE   SPECIFIC GRAVITY, URINE 1.020 LEUKOCYTE ESTERASE NEGATIVE          Assessment:  Patient is a 29 year old woman with a history of major depressive disorder and posttraumatic stress disorder managed on lurasidone 20 mg nightly.      Recommendations:  1.  I concur with primary care physician to obtain hemoglobin A1c and lipid panel serum labs.  2.  Recommend AIMS exam performed yearly as patient has been on atypical antipsychotics for over a year.    3.  Recommend daily exercise to elevate mood and decrease anxiety symptoms.  4.  Recommend continuation of lurasidone  20 mg nightly as patient reports symptom improvement.    5.  Recommend screening with PHQ9 and GAD7 for symptom tracking every three months. (Note:for PTSD symptom monitoring, normally use PCL, but to access this in the system this can be difficult. GAD 7 will suffice for now).    Please let me know whether I can be of further assistance and please let me know how the patient responds to your management.    Sincerely yours,    Gerald Stabs, MD    Patient Disposition : REGULAR FOLLOW UP VISIT NEEDED    Time spent on this e-Consultation :  21-30 minutes, >50% of the total time devoted to medical consultative verbal/ EMR discussion between the providers written and verbal report will be generated     Billing notes:  Should be included on level of service "[place] Case Review [[id number]]"

## 2020-10-15 NOTE — Progress Notes (Signed)
[facility] FAMILY MEDICINE  Telehealth Visit Note   Subjective:   CC: Danielle James is a 29 year old female patient who presents today for telehealth visit for No chief complaint on file.      Phone number: [phone]     #Metal taste in mouth  Pt had heartburn but better  Metal taste started after famotidine  No abdominal pain- just the burning  No trouble swallowing  Didi complete tx for schisto  Neg h pylori this month    #MDD/PTSD  Currently on latuda 20mg  nightly   Helps with sleep  Feels more calm  Also helps with anxiety  Been on it almost a year   Not been on olanzapine, paroxetine, clonazepam since left [place]  Denies SI and HI    ROS:  Per HPI     [DISCONTINUED] famotidine (PEPCID) 20 MG tablet, Take 1 tablet by mouth 2 (two) times daily, Disp: 60 tablet, Rfl: 0    No current facility-administered medications on file prior to visit.    Objective:     Exam - vitals deferred in setting of COVID pandemic  Speaking in complete sentences, breathing comfortably  Able to concentrate throughout visit  MENTAL STATUS EXAM:  Appearance: casually dressed and groomed, good eye contact  Behavior: friendly and cooperative  Alertness: Able to concentrate throughout intake  Speech: WNL  Mood: "OK right now"  Affect: congruent  Thought Process: logical, linear, goal-oriented  Thought Content: No evidence of delusions  Perceptions: No evidence of hallucinations  Judgment/Impulse Control: good   Insight: good  Cognition: ox3  Suicidal/Homicidal: denies    PHQ-9 TOTAL SCORE 09/19/2020 07/19/2018   Doc FlowSheet Total Score 23 27     Assessment & Plan:     1. MDD (major depressive disorder), severe ([place])  2. PTSD (post-traumatic stress disorder)  Pt doing well per her report. Last PHq-9 elevated.   Pt previously on olanzapine, paroxetine and clonazepam. Now just on latuda  Reviewed medication and pt has had some lab monitoring but may benefit from hgba1c and lipid panel as well  Also unclear if medication will be covered by  insurance. Full psych referral placed on 10/6 but pt on waitlist. Will place telepsych to review medication and recommendation for monitoring while referral pending. Refill placed so patient does not run out in meantime or if there are insurance difficulties.  - Lurasidone HCl (LATUDA) 20 MG TABS; Take 20 mg by mouth nightly  Dispense: 30 tablet; Refill: 0  - REFERRAL TO E-CONSULTATION TELEPSYCHIATRY    3. Epigastric pain  Improved but pt now with metallic taste. Change in taste is a potential side effect of famotidine so will stop and trial 1 month of omeprazole  - omeprazole (PRILOSEC) 20 MG capsule; Take 1 capsule by mouth daily  Dispense: 30 capsule; Refill: 0    4. Healthcare maintenance  Pt to for CPEX with pap, imms and labs per Hm  Message sent to front desk to call pt to schedule    We discussed the patients current medications. The patient expressed understanding and no barriers to adherence were identified.  Fredirick Lathe, MD 10/15/2020

## 2020-10-22 ENCOUNTER — Telehealth (HOSPITAL_BASED_OUTPATIENT_CLINIC_OR_DEPARTMENT_OTHER): Payer: Self-pay | Admitting: Family Medicine

## 2020-10-22 DIAGNOSIS — Z1159 Encounter for screening for other viral diseases: Secondary | ICD-10-CM

## 2020-10-22 DIAGNOSIS — F4310 Post-traumatic stress disorder, unspecified: Secondary | ICD-10-CM

## 2020-10-22 DIAGNOSIS — Z79899 Other long term (current) drug therapy: Secondary | ICD-10-CM

## 2020-10-22 DIAGNOSIS — F322 Major depressive disorder, single episode, severe without psychotic features: Secondary | ICD-10-CM

## 2020-10-22 NOTE — Telephone Encounter (Signed)
Called pt to discuss psych recommendation  Will also send to PCP to discuss at [facility] scheduled in December    1.  I concur with primary care physician to obtain hemoglobin A1c and lipid panel serum labs.  2.  Recommend AIMS exam performed yearly as patient has been on atypical antipsychotics for over a year.    3.  Recommend daily exercise to elevate mood and decrease anxiety symptoms.  4.  Recommend continuation of lurasidone 20 mg nightly as patient reports symptom improvement.    5.  Recommend screening with PHQ9 and GAD7 for symptom tracking every three months. (Note:for PTSD symptom monitoring, normally use PCL, but to access this in the system this can be difficult. GAD 7 will suffice for now    Famotidine works better than omeprazole but give her iron taste   She will try cutting down to once daily and see if side effect improve    Labs ordered and cc'ed PCP. Pt will get them done at [facility]

## 2020-10-23 ENCOUNTER — Telehealth (HOSPITAL_BASED_OUTPATIENT_CLINIC_OR_DEPARTMENT_OTHER): Payer: Self-pay | Admitting: Student in an Organized Health Care Education/Training Program

## 2020-10-23 ENCOUNTER — Telehealth (HOSPITAL_BASED_OUTPATIENT_CLINIC_OR_DEPARTMENT_OTHER): Payer: Self-pay | Admitting: Family Medicine

## 2020-10-23 DIAGNOSIS — F322 Major depressive disorder, single episode, severe without psychotic features: Secondary | ICD-10-CM

## 2020-10-23 DIAGNOSIS — F4310 Post-traumatic stress disorder, unspecified: Secondary | ICD-10-CM

## 2020-10-23 MED FILL — LATUDA 20MG: 30 days supply | Qty: 30 | Fill #0 | Status: CP

## 2020-10-23 NOTE — Telephone Encounter (Signed)
[place]  P Call Note    S:   Paged by answering service RE: Danielle James regarding Medication problem     B:   A portugese interpreter was used during this encounter due to language barrier.     Patient endorses needing a psychiatric medication. Patient states she came [place] her [place] and there is a medication she has been using for a year and that medication has run out, [place]. When her step father went to get the medication from the pharmacy they were told they need pre-authorization. Pre-authorization was sent this evening at 1713 per chart review.       Current Outpatient Medications:     omeprazole (PRILOSEC) 20 MG capsule, Take 1 capsule by mouth daily, Disp: 30 capsule, Rfl: 0    Lurasidone HCl (LATUDA) 20 MG TABS, Take 20 mg by mouth nightly, Disp: 30 tablet, Rfl: 0  Review of Patient's Allergies indicates:   Lithium                 Rash    A:   Danielle James is a 29 year old female with medication refill request, with a prior authorization. Her medication ran out yesterday.     R:   Patient has run out of psychiatric medicine and I worry that she may have withdrawal from not being on this medication, she is approaching day 2 of not having the medication. [place] and elected to do a bridging dose while awaiting prior authorization. Patient to pick up medication.     Disposition: Discussed case with [place], will do emergency override but need prior authorization in future      Chart routed to Dr. Rayburn Go and PCP for review.    Miles Costain, MD [facility]   10/23/2020 5:48 PM

## 2020-10-23 NOTE — Telephone Encounter (Signed)
Prior authorization request for [place]     Patient has hsno for prescription coverage.  I.D.# [id number]      BIN# :[id number]  PCN#:[id number]  Group:[id number]     Phone number: [phone]

## 2020-10-24 ENCOUNTER — Other Ambulatory Visit (HOSPITAL_BASED_OUTPATIENT_CLINIC_OR_DEPARTMENT_OTHER): Payer: Self-pay

## 2020-10-24 NOTE — Progress Notes (Signed)
.  Patient Name: [name]  Preferred Phone Number: [phone]  Episodes Linked to this Visit     Name Type Noted    [facility] Case Work 10/24/2020          Providers involved with care (Care Team):  Patient Care Team:  Terence Lux, MD as PCP - General  None    Non-providers involved with patient:    Reason for Referral and Resolution of Issues:  Metabolic Monitoring   Resolution included:   outreach to patient; patient will go to lab       Summary of Actions Taken to Resolve Issues:     Contacted pt for the first time and was able to reach them regarding metabolic monitoring. I informed them of who I was and the importance of an A1C and Lipid test. They agreed to go to the lab so I connected them with the lab phlebotomy scheduling. They are all set to go to [facility] on 10/25/2020 at 2:30 pm.

## 2020-10-24 NOTE — Telephone Encounter (Signed)
Please note that patient has [employer] for prescription insurance and their plan covers latuda if patient has tried and failed two second-generation antipsychotics:     -clozapine   -risperidone   -olanzapine   -quetiapine   -ziprasidone   -aripiprazole

## 2020-10-25 ENCOUNTER — Ambulatory Visit: Payer: No Typology Code available for payment source | Attending: Family Medicine

## 2020-10-25 ENCOUNTER — Other Ambulatory Visit: Payer: Self-pay

## 2020-10-25 ENCOUNTER — Other Ambulatory Visit (HOSPITAL_BASED_OUTPATIENT_CLINIC_OR_DEPARTMENT_OTHER): Payer: Self-pay

## 2020-10-25 ENCOUNTER — Telehealth (HOSPITAL_BASED_OUTPATIENT_CLINIC_OR_DEPARTMENT_OTHER): Payer: Self-pay | Admitting: Registered Nurse

## 2020-10-25 DIAGNOSIS — Z1159 Encounter for screening for other viral diseases: Secondary | ICD-10-CM | POA: Diagnosis present

## 2020-10-25 DIAGNOSIS — Z79899 Other long term (current) drug therapy: Secondary | ICD-10-CM | POA: Diagnosis present

## 2020-10-25 LAB — HEPATITIS B CORE ANTIBODY: HEPATITIS B CORE ANTIBODY: NONREACTIVE

## 2020-10-25 LAB — HEPATITIS C ANTIBODY: HEPATITIS C ANTIBODY: NONREACTIVE

## 2020-10-25 LAB — HEPATITIS B SURFACE ANTIGEN: HEPATITIS B SURFACE ANTIGEN: NONREACTIVE

## 2020-10-25 LAB — LIPID PANEL
Cholesterol: 189 mg/dL (ref 0–239)
HIGH DENSITY LIPOPROTEIN: 45 mg/dL (ref 40–?)
LOW DENSITY LIPOPROTEIN DIRECT: 111 mg/dL (ref 0–189)
TRIGLYCERIDES: 132 mg/dL (ref 0–150)

## 2020-10-25 LAB — HEPATITIS B SURFACE AB QUANT: HEPATITIS B SURFACE AB QUANT: 7.24 m[IU]/mL (ref 0.0–8.4)

## 2020-10-25 NOTE — Progress Notes (Signed)
Labs drawn.  Josetta Huddle, [place], 10/25/2020

## 2020-10-25 NOTE — Telephone Encounter (Signed)
-----   Message from [place] sent at 10/23/2020  3:26 PM EST -----  Regarding: meds discussion  Contact: [phone]  Danielle James [id number], 29 year old, female    Calls today:  Clinical Questions ([place])    Name of person calling claudio   Specific nature of request patient's father is calling to speak to pcp regarding patient's meds   Return phone number [phone]  Person calling on behalf of patient: Father    Patient's language of care: [place]    Patient does not need an interpreter.    Patient's PCP: Terence Lux, MD

## 2020-10-25 NOTE — Telephone Encounter (Signed)
Called pt back; she states that they are all set.

## 2020-10-26 LAB — HIV ANTIGEN ANTIBODY 5TH GEN: HIVAGAB QUALITATIVE: NONREACTIVE

## 2020-10-26 LAB — HEMOGLOBIN A1C
ESTIMATED AVERAGE GLUCOSE: 100 mg/dL (ref 74–160)
HEMOGLOBIN A1C: 5.1 % (ref 4.0–5.6)

## 2020-10-26 LAB — RPR: RPR QUALITATIVE: NONREACTIVE

## 2020-10-26 NOTE — Telephone Encounter (Signed)
Called pt to discuss medication and to review labs that are back    Review labs  Normal   Not immune to Hep B- can discuss risk/indication at in person visit  Quant Gold pending      Pt received 30d supply of Latuda to cover  Placed new referral for psychopharm eval to possibly prevent pt from having to change medication either at all or 2 times.  If psychopharm eval does not happen before 21 days, pt will call and will enact plan below for taper/start of aripiprazole.   Pt endorses understanding      1. There is a low risk of withdrawal from antipsychotic class medications.    2.  Please advise the patient that this medication change is an interim recommendation and is not intended for long-term off label treatment of her symptoms associated with major depressive disorder and posttraumatic stress disorder.  3.  Recommend reducing the lurasidone dose by 50% on day 1 and stopping it on day 7.  Also on day 1, start aripiprazole at 10 mg daily.  There is a the risk of akathisia, agitation, irritability and insomnia at higher doses.  If this occurs, decrease to aripiprazole 5 mg PO each day  4. Recommend one-time psychopharmacology eval by a psychiatrist through [facility] clinic for proper evaluation and to determine which symptoms the patient is attempting to target with medication.

## 2020-10-26 NOTE — e-consult (Signed)
Psychiatry e-Consultation    Thank you for referring your patient for an e-consultation. I have not interviewed or examined the patient. My recommendations here are based on the review of relevant clinical information and information provided by the referring provider.    Consultation request:   "Unfortunately latuda is not covered. Are you able to review for next recommendation since you did the initial e-consult or should I place a new e-consult. " Per primary care physician     Case Summary:  Patient is unable to be prescribed lurasidone - which I had initially recommended maintaining patient on this medication due to     Assessment:  Patient is a 29 year old woman transferring her medical care care from outside of [place], where she was prescribed and maintained on lurasidone for major depressive disorder and posttraumatic stress disorder.  This medication is not covered by her current insurer, [employer], unless patient were to fail medical trials of at least 2 other second-generation antipsychotic medications.    It is not customary in the [place] for a patient to be on atypical antipsychotics for major depression, without failing multiple trials of antidepressant class medications.  Additionally, lurasidone only has an FDA indication for use in a depressive phase in the context of bipolar 1 disorder in the [place].    However, concern for patient stability should we change medication from one class to another at this time.    Atypical antipsychotic class medications that are covered by [employer] INCLUDE:              -clozapine -the risk is high and the benefit is low and there is no FDA indication for this medication to be used in this patient's case              -olanzapine -metabolic profile of long-term use of olanzapine is drastic and I recommend against using olanzapine at this time.   -risperidone  -there is no an FDA indication for use and major depressive disorder for this medication.            -quetiapine   -this is the only medication on his list that has an indication for major depressive disorder, but it is used in acute major depressive disorder and as an adjunct of therapy.                 -ziprasidone -this medication has no FDA indication for major depressive disorder                 -aripiprazole -I will recommend this antipsychotic at this time as it has the lowest association with metabolic abnormalities and however it carries the [place] FDA indication as an adjunct to standard antidepressant therapy.      None of these medications are indicated for use with posttraumatic stress disorder        Recommendations:  1. There is a low risk of withdrawal from antipsychotic class medications.    2.  Please advise the patient that this medication change is an interim recommendation and is not intended for long-term off label treatment of her symptoms associated with major depressive disorder and posttraumatic stress disorder.  3.  Recommend reducing the lurasidone dose by 50% on day 1 and stopping it on day 7.  Also on day 1, start aripiprazole at 10 mg daily.  There is a the risk of akathisia, agitation, irritability and insomnia at higher doses.  If this occurs, decrease to aripiprazole 5 mg PO each day  4. Recommend one-time psychopharmacology eval by a psychiatrist through [facility] clinic for proper evaluation and to determine which symptoms the patient is attempting to target with medication.      Please let me know whether I can be of further assistance and please let me know how the patient responds to your management.    Sincerely yours,    Gerald Stabs, MD    Patient Disposition : REGULAR FOLLOW UP VISIT NEEDED    Time spent on this e-Consultation :  21-30 minutes, >50% of the total time devoted to medical consultative verbal/ EMR discussion between the providers written and verbal report will be generated

## 2020-10-26 NOTE — Progress Notes (Signed)
Pt informed by phone. See telephone encounter

## 2020-10-28 LAB — QUANTIFERON-TB GOLD PLUS
QFT PLUS INTERPRETATION: NEGATIVE
QFT PLUS MITOGEN MINUS NIL: 8.488 IU/mL
QFT PLUS NIL VALUE: 0.043 IU/mL
QFT PLUS TB AG1 MINUS NIL: 0.015 IU/mL
QFT PLUS TB AG2 MINUS NIL: 0.004 IU/mL

## 2020-10-29 NOTE — Progress Notes (Signed)
Neg. No change in management

## 2020-11-23 ENCOUNTER — Ambulatory Visit
Payer: No Typology Code available for payment source | Attending: Psychosomatic Medicine | Admitting: Psychosomatic Medicine

## 2020-11-23 DIAGNOSIS — F4310 Post-traumatic stress disorder, unspecified: Secondary | ICD-10-CM | POA: Insufficient documentation

## 2020-11-23 DIAGNOSIS — F419 Anxiety disorder, unspecified: Secondary | ICD-10-CM | POA: Diagnosis present

## 2020-11-23 DIAGNOSIS — F322 Major depressive disorder, single episode, severe without psychotic features: Secondary | ICD-10-CM | POA: Diagnosis present
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
## 2020-11-25 ENCOUNTER — Encounter (HOSPITAL_BASED_OUTPATIENT_CLINIC_OR_DEPARTMENT_OTHER): Payer: Self-pay

## 2020-11-25 ENCOUNTER — Emergency Department
Admission: EM | Admit: 2020-11-25 | Discharge: 2020-11-25 | Disposition: A | Discharge status: Home / Self Care | Payer: No Typology Code available for payment source | Attending: Emergency Medicine | Admitting: Emergency Medicine

## 2020-11-25 DIAGNOSIS — K2900 Acute gastritis without bleeding: Secondary | ICD-10-CM

## 2020-11-25 DIAGNOSIS — R1013 Epigastric pain: Secondary | ICD-10-CM | POA: Diagnosis present

## 2020-11-25 LAB — CBC, PLATELET & DIFFERENTIAL
ABSOLUTE BASO COUNT: 0 10*3/uL (ref 0.0–0.1)
ABSOLUTE EOSINOPHIL COUNT: 0.2 10*3/uL (ref 0.0–0.8)
ABSOLUTE IMM GRAN COUNT: 0.04 10*3/uL — ABNORMAL HIGH (ref 0.00–0.03)
ABSOLUTE LYMPH COUNT: 2.9 10*3/uL (ref 0.6–5.9)
ABSOLUTE MONO COUNT: 0.6 10*3/uL (ref 0.2–1.4)
ABSOLUTE NEUTROPHIL COUNT: 5.3 10*3/uL (ref 1.6–8.3)
ABSOLUTE NRBC COUNT: 0 10*3/uL (ref 0.0–0.0)
BASOPHIL %: 0.4 % (ref 0.0–1.2)
EOSINOPHIL %: 2.1 % (ref 0.0–7.0)
HEMATOCRIT: 37.6 % (ref 34.1–44.9)
HEMOGLOBIN: 13 g/dL (ref 11.2–15.7)
IMMATURE GRANULOCYTE %: 0.4 % (ref 0.0–0.4)
LYMPHOCYTE %: 31.7 % (ref 15.0–54.0)
MEAN CORP HGB CONC: 34.6 g/dL (ref 31.0–37.0)
MEAN CORPUSCULAR HGB: 29.5 pg (ref 26.0–34.0)
MEAN CORPUSCULAR VOL: 85.5 fl (ref 80.0–100.0)
MEAN PLATELET VOLUME: 9.9 fL (ref 8.7–12.5)
MONOCYTE %: 6.4 % (ref 4.0–13.0)
NEUTROPHIL %: 59 % (ref 40.0–75.0)
NRBC %: 0 % (ref 0.0–0.0)
PLATELET COUNT: 368 10*3/uL (ref 150–400)
RBC DISTRIBUTION WIDTH STD DEV: 39.3 fL (ref 35.1–46.3)
RED BLOOD CELL COUNT: 4.4 M/uL (ref 3.90–5.20)
WHITE BLOOD CELL COUNT: 9 10*3/uL (ref 4.0–11.0)

## 2020-11-25 LAB — LIPASE: LIPASE: 130 U/L (ref 73–393)

## 2020-11-25 LAB — URINALYSIS RFLX TO URINE CULT
BILIRUBIN, URINE: NEGATIVE
CASTS: NONE SEEN PER LPF
CRYSTALS: NONE SEEN
GLUCOSE, URINE: NEGATIVE MG/DL
KETONE, URINE: NEGATIVE MG/DL
NITRITE, URINE: NEGATIVE
OCCULT BLOOD, URINE: NEGATIVE
PH URINE: 6 (ref 5.0–8.0)
PROTEIN, URINE: NEGATIVE MG/DL
SPECIFIC GRAVITY URINE: 1.015 (ref 1.003–1.035)
SQUAMOUS EPITHELIAL CELLS: >10 PER LPF — AB (ref 0–4)

## 2020-11-25 LAB — POC URINALYSIS
BILIRUBIN, URINE: NEGATIVE
GLUCOSE,URINE: NEGATIVE
KETONE, URINE: NEGATIVE
NITRITE, URINE: NEGATIVE
OCCULT BLOOD, URINE: NEGATIVE
PH URINE: 6.5 (ref 5.0–8.0)
PROTEIN, URINE: NEGATIVE
SPECIFIC GRAVITY, URINE: 1.02 (ref 1.003–1.030)
UROBILINOGEN URINE: 0.2 (ref 0.2–1.0)

## 2020-11-25 LAB — HEPATIC FUNCTION PANEL
ALANINE AMINOTRANSFERASE: 19 U/L (ref 12–45)
ALBUMIN: 3.3 g/dL — ABNORMAL LOW (ref 3.4–5.0)
ALKALINE PHOSPHATASE: 100 U/L (ref 45–117)
ASPARTATE AMINOTRANSFERASE: 12 U/L (ref 8–34)
BILIRUBIN DIRECT: <0.1 mg/dl (ref 0.0–0.2)
BILIRUBIN TOTAL: 0.2 mg/dL (ref 0.2–1.0)
TOTAL PROTEIN: 7.7 g/dL (ref 6.4–8.2)

## 2020-11-25 LAB — BASIC METABOLIC PANEL
ANION GAP: 6 mmol/L (ref 5–15)
BUN (UREA NITROGEN): 14 mg/dL (ref 7–18)
CALCIUM: 9 mg/dL (ref 8.5–10.1)
CARBON DIOXIDE: 27 mmol/L (ref 21–32)
CHLORIDE: 106 mmol/L (ref 98–107)
CREATININE: 0.9 mg/dL (ref 0.4–1.2)
ESTIMATED GLOMERULAR FILT RATE: >60 mL/min (ref >60)
Glucose Random: 102 mg/dL (ref 74–160)
POTASSIUM: 4.3 mmol/L (ref 3.5–5.1)
SODIUM: 139 mmol/L (ref 136–145)

## 2020-11-25 LAB — URINE CULTURE WILL NOT BE DONE

## 2020-11-25 LAB — MAGNESIUM: MAGNESIUM: 1.9 mg/dL (ref 1.8–2.4)

## 2020-11-25 LAB — URINE PREGNANCY TEST (POINT OF CARE): HCG QUALITATIVE URINE: NEGATIVE

## 2020-11-25 MED ORDER — ALUMINUM & MAGNESIUM HYDROXIDE 200-200 MG/5ML PO SUSP
30.00 mL | Freq: Once | ORAL | Status: AC
Start: 2020-11-25 — End: 2020-11-25
  Administered 2020-11-25: 30 mL via ORAL
  Filled 2020-11-25: qty 30

## 2020-11-25 MED ORDER — FAMOTIDINE 20 MG/2ML IV SOLN
20.00 mg | Freq: Once | INTRAVENOUS | Status: AC
Start: 2020-11-25 — End: 2020-11-25
  Administered 2020-11-25: 20 mg via INTRAVENOUS
  Filled 2020-11-25: qty 2

## 2020-11-25 MED ORDER — SUCRALFATE 1 GM/10ML PO SUSP
1.0000 g | Freq: Three times a day (TID) | ORAL | 0 refills | Status: DC
Start: 2020-11-25 — End: 2020-11-28

## 2020-11-25 MED ORDER — LIDOCAINE VISCOUS HCL 2 % MT SOLN
10.00 mL | Freq: Once | OROMUCOSAL | Status: AC
Start: 2020-11-25 — End: 2020-11-25
  Administered 2020-11-25: 10 mL via ORAL
  Filled 2020-11-25: qty 15

## 2020-11-25 MED ORDER — ONDANSETRON HCL 4 MG/2ML IJ SOLN
4.00 mg | Freq: Once | INTRAMUSCULAR | Status: AC
Start: 2020-11-25 — End: 2020-11-25
  Administered 2020-11-25: 4 mg via INTRAVENOUS
  Filled 2020-11-25: qty 2

## 2020-11-25 NOTE — ED Provider Notes (Signed)
The patient was seen primarily by me. ED nursing record was reviewed. Prior records as available electronically through the Epic record were reviewed.  Care language: [place]); Interpreter used.     HPI:    This is a 29 year old female patient presenting with gastric abdominal pain.  Patient reports that she was diagnosed with gastritis in [place] by endoscopy.  She received an endoscopy in March 2021, and showed erosive pangastritis.  She took omeprazole for about 3 months, and her symptoms improved.  She was doing well for a few months, however about a month ago she her symptoms became worse again.  She has been taking Pepcid for her symptoms as needed.  Over the past 4 days, her symptoms have been much worse, she has been having constant epigastric pain, nausea, and difficulty tolerating p.o.'s, including her other prescribed home medications.  She has been having some vomiting.  She has had no fevers or diarrhea.  She has had no lower abdominal pain, urinary symptoms, flank pain. Worse after eating, some relief w pepcid but not much. Has taken a couple doses of omeprazole as well over the past few days which has not helped.       Location: Epigastric  Quality: Cramping/stabbing  Severity: Moderate to severe  Duration: Acutely for the past 4 days, chronically for months  Timing: Intermittent initially, now constant  Context: See HPI  Modifying factors: See HPI    ROS: Pertinent positives were reviewed as per the HPI above.     Constitutional: No fevers, chills  Eyes: No vision changes  ENT: No oropharyngeal swelling, no epistaxis  Cardiovascular: No chest pain  Resp: No shortness of breath  GI: see HPI  GU: No dysuria  MSK: No recent fractures  SKIN: No rashes  Neuro: No focal weakness  Psych: No SI    All other pertinent systems were reviewed and are negative.      Past Medical History/Problem list:  History reviewed. No pertinent past medical history.  Patient Active Problem List:     MDD (major  depressive disorder), severe (HCC)     PTSD (post-traumatic stress disorder)     Schistosomiasis     Epigastric pain     Annual physical exam        Past Surgical History: History reviewed. No pertinent surgical history.      Medications:   No current facility-administered medications for this encounter.     Current Outpatient Medications   Medication Sig    sucralfate (CARAFATE) 1 GM tablet Take 1 tablet by mouth 4 (four) times daily as needed         Social History: Social History    Tobacco Use      Smoking status: Never Smoker      Smokeless tobacco: Never Used    Alcohol use: Not on file        Allergies:  Review of Patient's Allergies indicates:   Lithium                 Rash      Physical Exam:  ED Triage Vitals [11/25/20 1920]   ED Triage Vitals Brief Group      Temp 99.1 F      Pulse 78      Resp 16      BP 137/88      SpO2 99 %      Pain Score        GENERAL: No acute distress.  SKIN:  Warm & Dry, no rash.  HEAD: Normocephalic, atraumatic. PERRL. EOMI.  Oropharynx: clear.  NECK: No midline tenderness, supple, normal ROM   LUNGS: Normal resp effort  CARDIOVASCULAR:  RRR.  Intact distal pulses.   ABDOMEN:  Soft, mild epigastric tenderness.  No guarding or rebound tenderness.   MUSCULOSKELETAL:  No obvious deformities.    NEUROLOGIC: Alert and oriented.  Moves all extremities well.  PSYCHIATRIC:  Appropriate for age, time of day, and situation.    Medications Given:  Medications   lidocaine (XYLOCAINE) 2 % viscous solution 10 mL (10 mLs Oral Given 11/25/20 2019)     And   aluminum-magnesium hydroxide (MAALOX) 200-200 mg/5 mL suspension 30 mL (30 mLs Oral Given 11/25/20 2019)   ondansetron (ZOFRAN) injection 4 mg (4 mg Intravenous Given 11/25/20 1957)   famotidine (PEPCID) injection 20 mg (20 mg Intravenous Given 11/25/20 1958)       Labs:  Labs Reviewed   POC URINALYSIS - Abnormal; Notable for the following components:       Result Value    LEUKOCYTE ESTERASE TRACE (*)     All other components within  normal limits   URINALYSIS RFLX TO URINE CULT - Abnormal; Notable for the following components:    LEUKOCYTE ESTERASE TRACE (*)     MICROSCOPIC SEE RESULTS (*)     BACTERIA 10-50 (*)     SQUAMOUS EPITHELIAL CELLS >10 (*)     All other components within normal limits    Narrative:     UCV&Urine, Clean Void   CBC, PLATELET & DIFFERENTIAL - Abnormal; Notable for the following components:    ABSOLUTE IMM GRAN COUNT 0.04 (*)     All other components within normal limits   HEPATIC FUNCTION PANEL - Abnormal; Notable for the following components:    ALBUMIN 3.3 (*)     All other components within normal limits    Narrative:     Tests added: HFT,LIP by SM on 11/25/20 at [place] by NS14.   BASIC METABOLIC PANEL    Narrative:     Tests added: HFT,LIP by SM on 11/25/20 at [place] by NS14.   MAGNESIUM    Narrative:     Tests added: HFT,LIP by SM on 11/25/20 at [place] by NS14.   LAB ADD ON/WRITE IN TEST   LIPASE    Narrative:     Tests added: HFT,LIP by SM on 11/25/20 at [place] by NS14.   URINE CULTURE WILL NOT BE DONE   URINE PREGNANCY TEST (POINT OF CARE)       Imaging:  No orders to display      ED Course and Medical Decision-making:    The patient is a  30 year old female presenting with epigastric pain w known h/o endoscopy confirmed gastritis. Pts sx have been well controlled for several months but have gotten worse recently. Pt is well appearing here, with no reassuring labs, suspect sx are secondary to gastritis as suspected by patient and history. Pt's sx much better following GI cocktail. Pt reports she already has Rx for omeprazole and pepcid, carafate added for sx relief. Pt counseled to f/u w PCP/GI for reassessment.     ED Course as of Nov 28 2118   Nancy Fetter Nov 25, 2020   2133 Feeling improved requesting discharge.          Additional verbal discharge instructions were provided including patients diagnosis and follow up plan, as well as reasons to return to the Emergency  Department which were discussed in detail.  Patient is  agreeable with this management.         Condition: Improved and Stable    Disposition:  Discharged to home    Diagnosis/Diagnoses:  Other acute gastritis without hemorrhage

## 2020-11-25 NOTE — ED Triage Note (Signed)
Pt presents to the ED c/o x3 days of epigastric pain and vomiting w/ decreased PO intake.     Reports a hx of gastritis several months ago but "doesn't think it healed". Has attempted home Rx medications but vomits them up.    Afebrile. -diarrhea.

## 2020-11-25 NOTE — Narrator Note (Signed)
Patient Disposition  Patient education for diagnosis, medications, activity, diet and follow-up.  Patient left ED 9:41 PM.  Patient rep received written instructions.    Interpreter to provide instructions: No    Patient belongings with patient: YES    Have all existing LDAs been addressed? Yes    Have all IV infusions been stopped? N/A    Destination: Discharged to home    RN in to discharge. Pt states full verbal understanding of all discharge instructions. Ambulated to the lobby with a steady gait and no distress .

## 2020-11-26 MED FILL — CARAFATE   SUS 1GM/10ML: 7 days supply | Qty: 210 | Fill #0 | Status: CP

## 2020-11-27 NOTE — Progress Notes (Signed)
HPI:  Danielle James is a 29 year old female who presents for CPEX      DM screening: WNL  HTN screening: WNL  Pap: Doesn't recall having one before  Contraception/PNV: takes birth control, Selene from [place]  Sexually active with husband. He is in [place]. Hasn't seen him in appx 3-4 months.    Nutrition: Mostly eating soups currently. Drinks water, ginger ale, milk, tea. Ginger ale helps with stomach    Substance use: None    #Mood: Has felt cranky because of discomfort. Mood slightly worse than usual. Has therapy follow up next month. Had psych appt for medication recommendations. Currently still taking Lurasidone. Wants to continue taking it for now and follow up to talk more about switching to Sertraline.    #Gastritis  - Using famotidine currently  - Stomach pain, nausea, particularly bothersome in the last week  - Went to ED last Sunday  - Can't sleep because of pain  - Used to take omeprazole, from march to August of this year  - famotidine was helpful for a few weeks and then stopped being  - Initially took it twice a day and then switched to once a day because of iron taste in mouth  - Avoiding oily or sweet foods  - Eating mostly soups  - No coffee, spicy food, alcohol  - feeling bloated, reflux  - No diarrhea, constipation, blood in stool  - Vomiting most days involuntarily    REVIEW OF SYSTEMS:  GEN: No fatigue, weight loss, weight gain, dizziness, syncope, fevers or chills  HEENT: No headache, visual changes, eye pain, blurry vision, earaches, hearing loss, eye or ear discharge, runny nose, mouth sores, sore throat.  CHEST: No chest pain or palpitations.  LUNGS: No SOB or cough.  GI: No diarrhea, constipation, or blood in stool.  GU: No dysuria, burning, itching, frequency, urgency or discharge.  MUSC: No muscle or joint pain.  NEURO: No numbness, tingling, weakness or seizures.    Medications, Allergies, MedHx, SocHx & FamHx reviewed, current in Epic.    Patient Active Problem  List:     MDD (major depressive disorder), severe (HCC)     PTSD (post-traumatic stress disorder)     Schistosomiasis     Epigastric pain     Annual physical exam      MEDS:    Current Outpatient Medications:     sucralfate (CARAFATE) 1 GM tablet, Take 1 tablet by mouth 4 (four) times daily as needed, Disp: 120 tablet, Rfl: 0    Review of Patient's Allergies indicates:   Lithium                 Rash    Social History     Socioeconomic History    Marital status: Single     Spouse name: Not on file    Number of children: Not on file    Years of education: Not on file    Highest education level: Not on file   Occupational History    Not on file   Tobacco Use    Smoking status: Never Smoker    Smokeless tobacco: Never Used   Substance and Sexual Activity    Alcohol use: Not on file    Drug use: Not on file    Sexual activity: Not on file   Other Topics Concern    Not on file   Social History Narrative    Lives with husband and mom    Homemaker  No exercise currently because not feeling well. Stopped going to the gym            Social Determinants of Health  Financial Resource Strain:     Difficulty of Paying Living Expenses: Not on file  Food Insecurity:     Worried About Charity fundraiser in the Last Year: Not on file    [employer] of Food in the Last Year: Not on file  Transportation Needs:     Lack of Transportation (Medical): Not on file    Lack of Transportation (Non-Medical): Not on file  Physical Activity:     Days of Exercise per Week: Not on file    Minutes of Exercise per Session: Not on file  Stress:     Feeling of Stress : Not on file  Social Connections:     Frequency of Communication with Friends and Family: Not on file    Frequency of Social Gatherings with Friends and Family: Not on file    Attends Religious Services: Not on file    Active Member of Clubs or Organizations: Not on file    Attends Archivist Meetings: Not on file    Marital Status: Not on  file  Intimate Partner Violence:     Fear of Current or Ex-Partner: Not on file    Emotionally Abused: Not on file    Physically Abused: Not on file    Sexually Abused: Not on file    No family history on file.      EXAM:  BP 109/71    Pulse 100    Temp 96 F (35.6 C) (Temporal)    Ht 5' 0.63" (1.54 m)    Wt 66.5 kg (146 lb 9.6 oz)    SpO2 98%    BMI 28.04 kg/m   Gen: well-appearing, cooperative, comfortable, well-groomed  Neck: supple, no lymphadenopathy  Lungs: clear to auscultation bilaterally, no wheezes/rales/ronchi  Heart: Regular rate and rhythm, normal S1 and S2, no murmurs, rubs or gallop  Abdomen: soft, nontender, nondistended, normoactive bowel sounds, no organomegaly or masses  GYN: Exam chaperoned by [place] . Normal labia and urethra. Vagina with normal rugae.   Cervix visualized.  No unusual discharge/odor.  No cervical motion tenderness.  Skin: no rashes or lesions   Neuro: alert and oriented, cranial nerves II-XII grossly intact, strength 5/5 bilaterally upper and lower extremities, sensation grossly intact, gait normal  Psych: alert, oriented, mood "cranky", affect normal, speech normal, thought process goal-oriented        PLAN:  Problem List Items Addressed This Visit        Unprioritized    MDD (major depressive disorder), severe (HCC)     Hx of depression and anxiety. Came to our practice on lurasidone. Saw psych who receommended switching to sertraline given no hx of bipolar. Pt currently taking lurasidone, but is interested in later switching to sertraline, but not at this time. Wants to follow up in a few weeks to discuss more.         Relevant Orders    THYROID SCREEN TSH REFLEX FT4 (Completed)    Epigastric pain     Hx of gastritis diagnosed in march 2021 in [place] by EGD. Prior labs with negative H pylori, positive schisto (which has since been treated). Was on omeprazole for 6 months without improvement in symptoms and then switched to famotidine. Decreased from BID to QD because of  metallic taste in mouth. Currently having reflux symptoms and  epigastric pain majority of days. Referred to GI and phone number provided to patient.         Relevant Medications    sucralfate (CARAFATE) 1 GM tablet    Other Relevant Orders    REFERRAL TO GASTROENTEROLOGY ( INT) (Completed)    Annual physical exam     Presenting for annual physical. Doing well with exception of GI symptoms. Mood slightly worse iso GI symptoms. No thoughts of self harm. Has upcoming therapy intake. Discussed and did lab exams that pt was due for. Vaccine records scanned for translation.          Relevant Orders    CHLAMYDIA GC NAAT (Completed)    CYTOPATH, C/V, THIN LAYER (Completed)          *The patient indicates understanding of these issues and agrees with the plan.  *The patient is given an AVS.    Terence Lux, MD, 11/29/2020  , PGY2  Discussed with Dr. Rosario Jacks

## 2020-11-28 ENCOUNTER — Ambulatory Visit: Payer: No Typology Code available for payment source | Attending: Family Medicine | Admitting: Family Medicine

## 2020-11-28 ENCOUNTER — Encounter (HOSPITAL_BASED_OUTPATIENT_CLINIC_OR_DEPARTMENT_OTHER): Payer: Self-pay | Admitting: Family Medicine

## 2020-11-28 ENCOUNTER — Other Ambulatory Visit: Payer: Self-pay

## 2020-11-28 VITALS — BP 109/71 | HR 100 | Temp 96.0°F | Ht 60.63 in | Wt 146.6 lb

## 2020-11-28 DIAGNOSIS — F322 Major depressive disorder, single episode, severe without psychotic features: Secondary | ICD-10-CM | POA: Diagnosis present

## 2020-11-28 DIAGNOSIS — F4310 Post-traumatic stress disorder, unspecified: Secondary | ICD-10-CM | POA: Diagnosis present

## 2020-11-28 DIAGNOSIS — Z0000 Encounter for general adult medical examination without abnormal findings: Secondary | ICD-10-CM

## 2020-11-28 DIAGNOSIS — R1013 Epigastric pain: Secondary | ICD-10-CM | POA: Insufficient documentation

## 2020-11-28 LAB — THYROID SCREEN TSH REFLEX FT4: THYROID SCREEN TSH REFLEX FT4: 1.76 u[IU]/mL (ref 0.358–3.740)

## 2020-11-28 MED ORDER — SUCRALFATE 1 G PO TABS
1.00 g | ORAL_TABLET | Freq: Four times a day (QID) | ORAL | 0 refills | Status: AC | PRN
Start: 2020-11-28 — End: 2020-12-28

## 2020-11-28 MED FILL — SUCRALFATE  1GM: 30 days supply | Qty: 120 | Fill #0 | Status: CP

## 2020-11-28 NOTE — Assessment & Plan Note (Signed)
Hx of depression and anxiety. Came to our practice on lurasidone. Saw psych who receommended switching to sertraline given no hx of bipolar. Pt currently taking lurasidone, but is interested in later switching to sertraline, but not at this time. Wants to follow up in a few weeks to discuss more.

## 2020-11-28 NOTE — Assessment & Plan Note (Addendum)
Hx of gastritis diagnosed in march 2021 in [place] by EGD. Prior labs with negative H pylori, positive schisto (which has since been treated). Was on omeprazole for 6 months without improvement in symptoms and then switched to famotidine. Decreased from BID to QD because of metallic taste in mouth. Currently having reflux symptoms and epigastric pain majority of days. Referred to GI and phone number provided to patient.

## 2020-11-28 NOTE — Patient Instructions (Signed)
Gastroenterology [phone]

## 2020-11-29 ENCOUNTER — Encounter (HOSPITAL_BASED_OUTPATIENT_CLINIC_OR_DEPARTMENT_OTHER): Payer: Self-pay | Admitting: Family Medicine

## 2020-11-29 LAB — CHLAMYDIA GC NAAT
CHLAMYDIA TRACHOMATIS NAAT: NEGATIVE
NEISSERIA GONORRHOEAE NAAT: NEGATIVE

## 2020-11-29 NOTE — Assessment & Plan Note (Signed)
Presenting for annual physical. Doing well with exception of GI symptoms. Mood slightly worse iso GI symptoms. No thoughts of self harm. Has upcoming therapy intake. Discussed and did lab exams that pt was due for. Vaccine records scanned for translation.

## 2020-12-03 ENCOUNTER — Telehealth (HOSPITAL_BASED_OUTPATIENT_CLINIC_OR_DEPARTMENT_OTHER): Payer: Self-pay | Admitting: Registered Nurse

## 2020-12-03 DIAGNOSIS — F322 Major depressive disorder, single episode, severe without psychotic features: Secondary | ICD-10-CM

## 2020-12-03 LAB — CYTOPATH, C/V, THIN LAYER

## 2020-12-03 NOTE — Telephone Encounter (Signed)
-----   Message from Jules Schick sent at 12/03/2020 11:44 AM EST -----  Regarding: following up on new medication from psych provider  Contact: [phone]  Danielle James [id number], 29 year old, female    Calls today:  Clinical Questions ([place])    Name of person calling Danielle James    Specific nature of request Pt is calling regarding a medication prescribed after psych intake appt on 12/10. Pt states she hasn't received the medication from pharmacy yet and was told to follow up with primary care about getting it prescribed. Please advise, thanks    Return phone number [phone]  Person calling on behalf of patient: Patient (self)    CALL BACK NUMBER: [phone]    Patient's language of care: [place])    Patient does not need an interpreter.    Patient's PCP: Terence Lux, MD

## 2020-12-03 NOTE — Telephone Encounter (Signed)
Called back and spoke with patient via interpreter.    Patient stated that she does want to start taking the Sertraline and would like the prescription sent to [facility] outpatient pharmacy.    Will forward to PCP.

## 2020-12-05 ENCOUNTER — Ambulatory Visit: Payer: No Typology Code available for payment source | Attending: Gastroenterology | Admitting: Gastroenterology

## 2020-12-05 DIAGNOSIS — R1013 Epigastric pain: Secondary | ICD-10-CM

## 2020-12-05 DIAGNOSIS — R634 Abnormal weight loss: Secondary | ICD-10-CM

## 2020-12-05 DIAGNOSIS — K219 Gastro-esophageal reflux disease without esophagitis: Secondary | ICD-10-CM

## 2020-12-05 DIAGNOSIS — R1114 Bilious vomiting: Secondary | ICD-10-CM | POA: Insufficient documentation

## 2020-12-05 DIAGNOSIS — R1110 Vomiting, unspecified: Secondary | ICD-10-CM | POA: Diagnosis present

## 2020-12-05 DIAGNOSIS — R110 Nausea: Secondary | ICD-10-CM | POA: Diagnosis present

## 2020-12-05 MED ORDER — SERTRALINE HCL 50 MG PO TABS
50.0000 mg | ORAL_TABLET | Freq: Every day | ORAL | 0 refills | Status: DC
Start: 2020-12-05 — End: 2020-12-21

## 2020-12-05 MED ORDER — PANTOPRAZOLE SODIUM 40 MG PO TBEC
40.00 mg | DELAYED_RELEASE_TABLET | Freq: Two times a day (BID) | ORAL | 2 refills | Status: AC
Start: 2020-12-05 — End: 2021-02-05

## 2020-12-05 MED FILL — PANTOPRAZOLE 40MG: 30 days supply | Qty: 60 | Fill #0

## 2020-12-05 MED FILL — SERTRALINE 50MG: 30 days supply | Qty: 30 | Fill #0

## 2020-12-05 NOTE — Progress Notes (Signed)
This 29 year old Danielle James ([place]) speaking female patient is referred by Dr. Gerarda Gunther for an opinion on gastritis.    Pt reports that she had an EGD in March in [place] for epigastric pain and vomiting for 2 weeks and and diagnosed with gastritis.  Not aware of h.pylori. Was on Pantoprazole bid for 5 months and dietary changes (cut out greasy foods, eating soups/salads) and felt better and stopped Pantoprazole in August.  Arrived to [place] in mid-September.  Within 2 weeks of arrival started feeling original symptoms (epigastric pain and vomiting).    Pain 2-3 times a week.  Lasts anywhere from minutes to hours.  Triggered by heavy foods: pizza or hamburger.  When in pain, vomits.  No vomiting when without pain.  Denies radiation of pain to R shoulder.  Nauseous all the time (not pregnant).    Returned to the diet that was on in [place].    Typically has reflux with any po's.  Has regurgitation.  Stools are fine, no change.     Tried Omeprazole 20 mg daily for a month (before breakfast) without any relief. Also tried Carafate and famotidine.  Stopped Omeprazole 2-3 weeks ago.  Doesn't feel worse since stopping Omeprazole.    No NSAID use.    Lost 3-4 kg over the past 3 months. Feels this is b/c she changed her diet.     ER visit to [facility] on December 12th: VSS (temp 99.1) and exam without acute distress and mild epigastric tenderness noted; otherwise soft abdomen.  Was given GI cocktail, which was helpful.    Mother with uterine cancer; no other malignancies.  No IBD or celiac disease.  The patient now presents for further consideration.    Past Medical Hx:  Patient Active Problem List:     MDD (major depressive disorder), severe (HCC)     PTSD (post-traumatic stress disorder)     Schistosomiasis     Epigastric pain     Annual physical exam      Past Surgical Hx:  No past surgical history on file.    Review of Patient's Allergies indicates:   Lithium                 Rash    Social Hx  Social History    Tobacco Use       Smoking status: Never Smoker      Smokeless tobacco: Never Used    Alcohol use: Not Currently    Drug use: Never      Current Outpatient Medications   Medication Sig    Lurasidone HCl (LATUDA) 20 MG TABS Take 20 mg by mouth nightly    famotidine (PEPCID) 20 MG tablet Take 1 tablet by mouth daily    sucralfate (CARAFATE) 1 GM tablet Take 1 tablet by mouth 4 (four) times daily as needed     No current facility-administered medications for this visit.       Family Hx:  No GI malignancies, IBD, celiac disease    REVIEW OF SYSTEMS:    As per HPI, no other concerns at this time.    Physical Exam: video visit   Vital Signs: There were no vitals taken for this visit. There is no height or weight on file to calculate BMI.  General: NAD.   HEENT: Normocephalic, atraumatic. No scleral icterus.   Neurological: CN grossly intact.   Psych: Alert, oriented x 3. Normal affect and mood (slightly flat affect).  Derm: No rashes on exposed areas.  Pertinent Labs:  Component      Latest Ref Rng & Units 10/25/2020   HIVAGAB QUALITATIVE      NONREACTIVE NON-REACTIVE   HEMOGLOBIN A1C      4.0 - 5.6 % 5.1     Component      Latest Ref Rng & Units 11/25/2020   WHITE BLOOD CELL COUNT      4.0 - 11.0 TH/uL 9.0   HEMOGLOBIN      11.2 - 15.7 g/dL 13.0   HEMATOCRIT      34.1 - 44.9 % 37.6   MEAN CORPUSCULAR VOL      80.0 - 100.0 fl 85.5   PLATELET COUNT      150 - 400 TH/uL 368   SODIUM      136 - 145 mmol/L 139   POTASSIUM      3.5 - 5.1 mmol/L 4.3   CHLORIDE      98 - 107 mmol/L 106   CARBON DIOXIDE      21 - 32 mmol/L 27   ANION GAP      5 - 15 mmol/L 6   CALCIUM      8.5 - 10.1 mg/dL 9.0   Glucose Random      74 - 160 mg/dL 102   BUN (UREA NITROGEN)      7 - 18 mg/dL 14   CREATININE      0.4 - 1.2 mg/dL 0.9   ESTIMATED GLOMERULAR FILT RATE      >60 ML/MIN > 60   TOTAL PROTEIN      6.4 - 8.2 g/dL 7.7   ALBUMIN      3.4 - 5.0 g/dL 3.3 (L)   BILIRUBIN TOTAL      0.2 - 1.0 mg/dL 0.2   BILIRUBIN DIRECT      0.0 - 0.2 mg/dl < 0.1   INDIRECT  BILIRUBIN      0.2 - 0.9 mg/dL TNP   ALKALINE PHOSPHATASE      45 - 117 U/L 100   ASPARTATE AMINOTRANSFERASE      8 - 34 U/L 12   ALANINE AMINOTRANSFERASE      12 - 45 U/L 19   LIPASE      73 - 393 U/L 130     Assessment and Plan: Pleasant 29 yo F with epigastric pain/voming that started in [place] in 02/2020 and EGD with "gastirtis" treated with bid Pantoprazole and dietary changes with resolution of symptoms.  Shortly after arrival to [place] in 08/2020, symptoms restarted with frequent GERD/regurgitation, epigastric pain and vomiting with fatty foods.  This time trial of Omeprazole and dietary changes not helpful.  ER visit with epigastric tenderness on exam, unremarkable hgb, lft's, lipase (albumin 3.3). Pt started on Carafate/Famotidine.  Pt's symptoms may be due to gastritis/esophagitis/PUD, GERD or cholelithiasis.  - proceed with EGD due to weight loss and pain despite PPI therapy  - abd [place] to check for biliary etiology (if gallstones present, may need eval in general surgery)  - check h.pylori stool Ag (prior to starting PPI)  - trial of Pantoprazole 40 mg bid (30-60 min prior to breakfast and dinner) for 4 weeks. If not better, discontinue after 4 weeks.  - discussed GERD diet/lifestyle modifications and discussed biliary sx.  - f/u after testing    All questions answered.  Pt understands and agrees with plan.     Professional interpreter services were offered and declined by patient. Adequate comprehension/pt fluent in [place].  I have spent 40 minutes with this patient/patient proxy on direct care and in counseling or coordination of care regarding above issues/Dx.

## 2020-12-05 NOTE — Telephone Encounter (Signed)
Pt called and had decided to switch over to sertraline from her Lurasidone. Discussed switching directly over. Had discussed potential side effects previously. Also discussed pts most recent labs (normal TSH, pap, and GC). Plan for follow up visit in ~2 weeks to discuss tolerability of sertraline and any dose change that may be necessary.    Guadalupe Dawn, MD, PGY-2, Family Medicine  12/05/20  12:56 PM

## 2020-12-10 NOTE — Progress Notes (Signed)
Preceptor Note  I personally reviewed this case, along with the patient's history and exam findings, with Dr. Gerarda Gunther. I confirm the findings, and agree with the assessment and plan, as documented in the visit note.    Bunnie Pion, DO

## 2020-12-11 ENCOUNTER — Emergency Department
Admission: EM | Admit: 2020-12-11 | Discharge: 2020-12-12 | Disposition: A | Discharge status: Home / Self Care | Payer: No Typology Code available for payment source | Attending: Emergency Medicine | Admitting: Emergency Medicine

## 2020-12-11 ENCOUNTER — Encounter (HOSPITAL_BASED_OUTPATIENT_CLINIC_OR_DEPARTMENT_OTHER): Payer: Self-pay

## 2020-12-11 ENCOUNTER — Emergency Department (HOSPITAL_BASED_OUTPATIENT_CLINIC_OR_DEPARTMENT_OTHER): Payer: No Typology Code available for payment source

## 2020-12-11 ENCOUNTER — Other Ambulatory Visit: Payer: Self-pay

## 2020-12-11 ENCOUNTER — Emergency Department
Admission: EM | Admit: 2020-12-11 | Discharge: 2020-12-11 | Disposition: A | Discharge status: Home / Self Care | Payer: No Typology Code available for payment source | Attending: Emergency Medicine | Admitting: Emergency Medicine

## 2020-12-11 DIAGNOSIS — Z79899 Other long term (current) drug therapy: Secondary | ICD-10-CM | POA: Diagnosis not present

## 2020-12-11 DIAGNOSIS — R1013 Epigastric pain: Secondary | ICD-10-CM | POA: Insufficient documentation

## 2020-12-11 DIAGNOSIS — Z8719 Personal history of other diseases of the digestive system: Secondary | ICD-10-CM | POA: Diagnosis not present

## 2020-12-11 LAB — CBC, PLATELET & DIFFERENTIAL
ABSOLUTE BASO COUNT: 0 10*3/uL (ref 0.0–0.1)
ABSOLUTE BASO COUNT: 0 10*3/uL (ref 0.0–0.1)
ABSOLUTE EOSINOPHIL COUNT: 0.1 10*3/uL (ref 0.0–0.8)
ABSOLUTE EOSINOPHIL COUNT: 0 10*3/uL (ref 0.0–0.8)
ABSOLUTE IMM GRAN COUNT: 0.02 10*3/uL (ref 0.00–0.03)
ABSOLUTE IMM GRAN COUNT: 0.02 10*3/uL (ref 0.00–0.03)
ABSOLUTE LYMPH COUNT: 2.7 10*3/uL (ref 0.6–5.9)
ABSOLUTE LYMPH COUNT: 1.8 10*3/uL (ref 0.6–5.9)
ABSOLUTE MONO COUNT: 0.6 10*3/uL (ref 0.2–1.4)
ABSOLUTE MONO COUNT: 0.4 10*3/uL (ref 0.2–1.4)
ABSOLUTE NEUTROPHIL COUNT: 5 10*3/uL (ref 1.6–8.3)
ABSOLUTE NEUTROPHIL COUNT: 5.1 10*3/uL (ref 1.6–8.3)
ABSOLUTE NRBC COUNT: 0 10*3/uL (ref 0.0–0.0)
ABSOLUTE NRBC COUNT: 0 10*3/uL (ref 0.0–0.0)
BASOPHIL %: 0.5 % (ref 0.0–1.2)
BASOPHIL %: 0.4 % (ref 0.0–1.2)
EOSINOPHIL %: 1.3 % (ref 0.0–7.0)
EOSINOPHIL %: 0.4 % (ref 0.0–7.0)
HEMATOCRIT: 36.4 % (ref 34.1–44.9)
HEMATOCRIT: 35 % (ref 34.1–44.9)
HEMOGLOBIN: 12.7 g/dL (ref 11.2–15.7)
HEMOGLOBIN: 12.2 g/dL (ref 11.2–15.7)
IMMATURE GRANULOCYTE %: 0.2 % (ref 0.0–0.4)
IMMATURE GRANULOCYTE %: 0.3 % (ref 0.0–0.4)
LYMPHOCYTE %: 32.3 % (ref 15.0–54.0)
LYMPHOCYTE %: 24.2 % (ref 15.0–54.0)
MEAN CORP HGB CONC: 34.9 g/dL (ref 31.0–37.0)
MEAN CORP HGB CONC: 34.9 g/dL (ref 31.0–37.0)
MEAN CORPUSCULAR HGB: 29.4 pg (ref 26.0–34.0)
MEAN CORPUSCULAR HGB: 29.3 pg (ref 26.0–34.0)
MEAN CORPUSCULAR VOL: 84.3 fl (ref 80.0–100.0)
MEAN CORPUSCULAR VOL: 84.1 fl (ref 80.0–100.0)
MEAN PLATELET VOLUME: 10.2 fL (ref 8.7–12.5)
MEAN PLATELET VOLUME: 10.3 fL (ref 8.7–12.5)
MONOCYTE %: 6.5 % (ref 4.0–13.0)
MONOCYTE %: 5.1 % (ref 4.0–13.0)
NEUTROPHIL %: 59.2 % (ref 40.0–75.0)
NEUTROPHIL %: 69.6 % (ref 40.0–75.0)
NRBC %: 0 % (ref 0.0–0.0)
NRBC %: 0 % (ref 0.0–0.0)
PLATELET COUNT: 342 10*3/uL (ref 150–400)
PLATELET COUNT: 301 10*3/uL (ref 150–400)
RBC DISTRIBUTION WIDTH STD DEV: 39.8 fL (ref 35.1–46.3)
RBC DISTRIBUTION WIDTH STD DEV: 39.9 fL (ref 35.1–46.3)
RED BLOOD CELL COUNT: 4.32 M/uL (ref 3.90–5.20)
RED BLOOD CELL COUNT: 4.16 M/uL (ref 3.90–5.20)
WHITE BLOOD CELL COUNT: 8.5 10*3/uL (ref 4.0–11.0)
WHITE BLOOD CELL COUNT: 7.3 10*3/uL (ref 4.0–11.0)

## 2020-12-11 LAB — POC URINALYSIS
BILIRUBIN, URINE: NEGATIVE
GLUCOSE,URINE: NEGATIVE
NITRITE, URINE: NEGATIVE
OCCULT BLOOD, URINE: NEGATIVE
PH URINE: 6 (ref 5.0–8.0)
PROTEIN, URINE: NEGATIVE
SPECIFIC GRAVITY, URINE: 1.025 (ref 1.003–1.030)
UROBILINOGEN URINE: 0.2 (ref 0.2–1.0)

## 2020-12-11 LAB — HEPATIC FUNCTION PANEL
ALANINE AMINOTRANSFERASE: 16 U/L (ref 12–45)
ALBUMIN: 3.2 g/dL — ABNORMAL LOW (ref 3.4–5.0)
ALKALINE PHOSPHATASE: 85 U/L (ref 45–117)
ASPARTATE AMINOTRANSFERASE: 14 U/L (ref 8–34)
BILIRUBIN DIRECT: 0.1 mg/dl (ref 0.0–0.2)
BILIRUBIN TOTAL: 0.3 mg/dL (ref 0.2–1.0)
INDIRECT BILIRUBIN: 0.2 mg/dL (ref 0.2–0.9)
TOTAL PROTEIN: 7.4 g/dL (ref 6.4–8.2)

## 2020-12-11 LAB — COMPREHENSIVE METABOLIC PANEL
ALANINE AMINOTRANSFERASE: 15 U/L (ref 12–45)
ALBUMIN: 3.1 g/dL — ABNORMAL LOW (ref 3.4–5.0)
ALKALINE PHOSPHATASE: 82 U/L (ref 45–117)
ANION GAP: 6 mmol/L (ref 5–15)
ASPARTATE AMINOTRANSFERASE: 12 U/L (ref 8–34)
BILIRUBIN TOTAL: 0.3 mg/dL (ref 0.2–1.0)
BUN (UREA NITROGEN): 7 mg/dL (ref 7–18)
CALCIUM: 9.2 mg/dL (ref 8.5–10.1)
CARBON DIOXIDE: 24 mmol/L (ref 21–32)
CHLORIDE: 107 mmol/L (ref 98–107)
CREATININE: 0.7 mg/dL (ref 0.4–1.2)
ESTIMATED GLOMERULAR FILT RATE: >60 mL/min (ref >60)
Glucose Random: 95 mg/dL (ref 74–160)
POTASSIUM: 4 mmol/L (ref 3.5–5.1)
SODIUM: 137 mmol/L (ref 136–145)
TOTAL PROTEIN: 7.6 g/dL (ref 6.4–8.2)

## 2020-12-11 LAB — URINE PREGNANCY TEST (POINT OF CARE): HCG QUALITATIVE URINE: NEGATIVE

## 2020-12-11 LAB — BASIC METABOLIC PANEL
ANION GAP: 5 mmol/L (ref 5–15)
BUN (UREA NITROGEN): 9 mg/dL (ref 7–18)
CALCIUM: 9 mg/dL (ref 8.5–10.1)
CARBON DIOXIDE: 26 mmol/L (ref 21–32)
CHLORIDE: 107 mmol/L (ref 98–107)
CREATININE: 0.7 mg/dL (ref 0.4–1.2)
ESTIMATED GLOMERULAR FILT RATE: >60 mL/min (ref >60)
Glucose Random: 97 mg/dL (ref 74–160)
POTASSIUM: 4 mmol/L (ref 3.5–5.1)
SODIUM: 138 mmol/L (ref 136–145)

## 2020-12-11 LAB — HCG QUALITATIVE SERUM: HCG QUALITATIVE SERUM: NEGATIVE

## 2020-12-11 LAB — LIPASE
LIPASE: 143 U/L (ref 73–393)
LIPASE: 111 U/L (ref 73–393)

## 2020-12-11 LAB — MAGNESIUM
MAGNESIUM: 2.1 mg/dL (ref 1.8–2.4)
MAGNESIUM: 2.1 mg/dL (ref 1.8–2.4)

## 2020-12-11 MED ORDER — SODIUM CHLORIDE 0.9 % IV BOLUS
1000.0000 mL | Freq: Once | INTRAVENOUS | Status: AC
Start: 2020-12-11 — End: 2020-12-11
  Administered 2020-12-11: 1000 mL via INTRAVENOUS

## 2020-12-11 MED ORDER — ALUMINUM & MAGNESIUM HYDROXIDE 200-200 MG/5ML PO SUSP
30.0000 mL | Freq: Once | ORAL | Status: AC
Start: 2020-12-11 — End: 2020-12-11
  Administered 2020-12-11: 30 mL via ORAL
  Filled 2020-12-11: qty 30

## 2020-12-11 MED ORDER — PANTOPRAZOLE SODIUM 40 MG IV SOLR
40.00 mg | Freq: Once | INTRAVENOUS | Status: AC
Start: 2020-12-11 — End: 2020-12-11
  Administered 2020-12-11: 40 mg via INTRAVENOUS
  Filled 2020-12-11: qty 40

## 2020-12-11 MED ORDER — ONDANSETRON HCL 4 MG/2ML IJ SOLN
4.0000 mg | Freq: Once | INTRAMUSCULAR | Status: DC
Start: 2020-12-11 — End: 2020-12-12
  Filled 2020-12-11: qty 2

## 2020-12-11 MED ORDER — LIDOCAINE VISCOUS HCL 2 % MT SOLN
10.0000 mL | Freq: Once | OROMUCOSAL | Status: AC
Start: 2020-12-11 — End: 2020-12-11
  Administered 2020-12-11: 10 mL via OROMUCOSAL
  Filled 2020-12-11: qty 15

## 2020-12-11 MED ORDER — ONDANSETRON HCL 4 MG/2ML IJ SOLN
4.0000 mg | Freq: Once | INTRAMUSCULAR | Status: AC
Start: 2020-12-11 — End: 2020-12-11
  Administered 2020-12-11: 4 mg via INTRAVENOUS
  Filled 2020-12-11: qty 2

## 2020-12-11 MED ORDER — FAMOTIDINE 20 MG/2ML IV SOLN
20.0000 mg | Freq: Once | INTRAVENOUS | Status: DC
Start: 2020-12-11 — End: 2020-12-12
  Filled 2020-12-11: qty 2

## 2020-12-11 NOTE — ED Triage Note (Signed)
Pt c/o worsening epigastric pain with n/v. Denies diarrhea, fevers. Pt was seen here earlier today for the same. Reports meds help but symptoms came back. Hx of gastritis, on pantoprazole

## 2020-12-11 NOTE — Narrator Note (Signed)
Report given to [place], RN

## 2020-12-11 NOTE — ED Provider Notes (Signed)
The patient was seen primarily by me. The nursing record was reviewed. Select prior records as available electronically through the Epic record were reviewed.    The patient was offered a [place] interpreter but declined, preferring to communicate in [place].    I began my evaluation of the patient at 7:31 AM.    HPI:    Danielle James is a 29 year old female patient who presents for evaluation of upper abdominal pain. This began last night after dinner. The pain kept her from sleeping. She reports a history of gastritis and this feels similar. She took sucralfate and famotidine with little relief. She has had non-bilious and non-bloody vomiting. No diarrhea.    ROS: Pertinent positives were reviewed as per the HPI above.    Specifically, no fever, chills, double vision, nosebleeds, cough, dyspnea, chest pain, rash, dysuria, joint pains, extremity swelling or headaches. All other systems reviewed and negative.    Allergies: Review of Patient's Allergies indicates:   Lithium                 Rash    Marquis Lunch  Language of care: [place]wood Kaplan)  MRN: [id number]  PCP: Terence Lux, MD  Mode of arrival: Self.  Arrival time:     Chief complaint: Abdominal Pain    Past Medical History:  History reviewed. No pertinent past medical history.    Social History:   Alcohol: No  Tobacco: No    Triage Vital Signs:   ED Triage Vitals [12/11/20 0727]   ED Triage Vitals Brief Group      Temp 98.3 F      Pulse 86      Resp 18      BP 142/89      SpO2 98 %      Pain Score 8        I reviewed the triage vital signs.    Medications:  Prior to Admission Medications   Prescriptions Last Dose Informant Patient Reported? Taking?   Lurasidone HCl (LATUDA) 20 MG TABS   Yes No   Sig: Take 20 mg by mouth nightly   famotidine (PEPCID) 20 MG tablet   Yes No   Sig: Take 1 tablet by mouth daily   pantoprazole (PROTONIX) 40 MG tablet   No No   Sig: Take 1 tablet by mouth 2 (two) times daily before meals   sertraline (ZOLOFT) 50 MG  tablet   No No   Sig: Take 1 tablet by mouth daily   sucralfate (CARAFATE) 1 GM tablet   No No   Sig: Take 1 tablet by mouth 4 (four) times daily as needed      Facility-Administered Medications: None       Physical Exam:   GENERAL: No acute distress, non-toxic     HEAD: No evidence of trauma.     EYES: Sclera anicteric.     NECK: Supple. No stridor.     PULMONARY: Respirations even and unlabored. No accessory muscle use.  Clear to auscultation bilaterally.    CARDIOVASCULAR: Regular rate and rhythm. No murmur.     ABDOMEN: Epigastric tenderness to palpation. Non-distended.  No guarding or rebound.    MUSCULOSKELETAL: No obvious deformities or trauma. Warm and well perfused.     SKIN: Warm and dry, no rash on exposed skin.    NEUROLOGIC: Alert and oriented x3. Speech fluent without dysarthria.     PSYCHIATRIC: Appropriate, cooperative.      Radiology Results:  RUQ  ultrasound: No acute pathology Medications Given:  Medications - No data to display   EKG:  N/A      Medical decision-making and course:  29 year old female patient presents with epigastric pain. I reviewed the patient's prior records, which show: prior visits for gastritis.    Differential diagnosis includes gastritis, PUD, biliary colic, pancreatitis, cholecystitis. Lipase is normal, ruling out pancreatitis. Ultrasound shows no gallstones or other biliary pathology. Labs are normal. She is not pregnant.    She felt better after pantoprazole and a GI cocktail. I encouraged her to take the pantoprazole as prescribed by her PCP.    I counseled the patient on her results, diagnosis, expected course, and treatments. I gave verbal discharge instructions, including reasons to return. She was in agreement with the plan for discharge and I answered all questions to her satisfaction.    Diagnosis/Diagnoses:  (R10.13) Epigastric pain     Disposition:  Discharged.    Condition at disposition:  Improved.    Philip Aspen, MD  12/11/2020   Emergency [place]    This Emergency Department patient encounter note was created using voice-recognition software and in real time during the ED visit. Please excuse any typographical errors that have not yet been reviewed and corrected.

## 2020-12-11 NOTE — Discharge Instructions (Signed)
Take the pantoprazole medication as prescribed. You can continue taking the sucralfate.    The blood tests and ultrasound were normal.

## 2020-12-11 NOTE — Narrator Note (Signed)
RN came to pt's room, she was present.  According the [facility], pt took her IV out and left.   Pt got upset because the doctor told her she will not get admitted and could not move her GI appointment sooner.   Pt refused meds.   Dr Mary Sella apologized to pt and explained to her why she felt she did not meet criteria   to be admitted.

## 2020-12-11 NOTE — Narrator Note (Signed)
Patient Disposition  Patient education for diagnosis, medications, activity, diet and follow-up.  Patient left ED 10:02 AM.  Patient rep received written instructions.    Interpreter to provide instructions: No    Patient belongings with patient: YES    Have all existing LDAs been addressed? Yes    Have all IV infusions been stopped? N/A    Destination: Discharged to home

## 2020-12-11 NOTE — ED Triage Note (Signed)
Pt arrives from home c/o abdominal pain that started last night with vomiting and inability to tolerate PO. Pt reports recent dx of gastritis. Took rx famotidine and sucralfate at 9pm with no relief. LBM yesterday normal . VSS.

## 2020-12-11 NOTE — ED Provider Notes (Signed)
The patient was seen primarily by me. ED nursing record was reviewed. Select prior records as available electronically through the Epic record were reviewed.    History, physical exam and disposition were conducted with an official hospital [place] interpreter.    HPI:    Danielle James is a 29 year old female patient who is presenting with acute on chronic abdominal pain.   Ongoing for the last 4 months, worsening. States has been diagnosed with gastritis in the past. Is on omeprazole. Taking twice a day. Reports she is not taking famotidine or carafate or additional medications with her PPI.   Reports having an endoscopy scheduled for 12/25/19 but is requesting this appointment to be expedited.   Seen in the ER earlier today, treated with improvement but states symptoms recurred.   No fevers. Nausea/vomiting. No diarrhea. No urinary symptoms.     ROS: Pertinent positives were reviewed as per the HPI above. All other systems were reviewed and are negative.  Marquis Lunch  Language of care: [place] Derwood Kaplan)  MRN: [id number]  PCP: Terence Lux, MD  Mode of arrival to ED: Self.  Arrival time:     Chief complaint: Epigastric Pain    Past Medical History/Problem list:  No past medical history on file.  Patient Active Problem List:     MDD (major depressive disorder), severe (HCC)     PTSD (post-traumatic stress disorder)     Schistosomiasis     Epigastric pain     Annual physical exam    Past Surgical History: No past surgical history on file.  Social History:   Social History     Socioeconomic History    Marital status: Single     Spouse name: Not on file    Number of children: Not on file    Years of education: Not on file    Highest education level: Not on file   Occupational History    Not on file   Tobacco Use    Smoking status: Never Smoker    Smokeless tobacco: Never Used   Substance and Sexual Activity    Alcohol use: Not Currently    Drug use: Never    Sexual activity: Yes      Partners: Male     Comment: Married, female partner, husband   Other Topics Concern    Not on file   Social History Narrative    Lives with husband and mom    Homemaker    No exercise currently because not feeling well. Stopped going to the gym iso GI symptoms        Terence Lux, MD, 11/29/2020                Social Determinants of Health  Financial Resource Strain:     Difficulty of Paying Living Expenses: Not on file  Food Insecurity:     Worried About [place] in the Last Year: Not on file    Ran Out of Food in the Last Year: Not on file  Transportation Needs:     Lack of Transportation (Medical): Not on file    Lack of Transportation (Non-Medical): Not on file  Physical Activity:     Days of Exercise per Week: Not on file    Minutes of Exercise per Session: Not on file  Stress:     Feeling of Stress : Not on file  Social Connections:     Frequency of Communication with Friends and Family: Not on  file    Frequency of Social Gatherings with Friends and Family: Not on file    Attends Religious Services: Not on file    Active Member of Clubs or Organizations: Not on file    Attends Archivist Meetings: Not on file    Marital Status: Not on file  Intimate Partner Violence:     Fear of Current or Ex-Partner: Not on file    Emotionally Abused: Not on file    Physically Abused: Not on file    Sexually Abused: Not on file   Allergies: Review of Patient's Allergies indicates:   Lithium                 Rash  Immunizations:   Immunization History   Administered Date(s) Administered    Covid-19 Vaccine [employer]) 09/03/2020    Influenza Virus Quad Presv Free Vacc 6 Mo and Older, IM 09/12/2020          Medications:  Prior to Admission Medications   Prescriptions Last Dose Informant Patient Reported? Taking?   Lurasidone HCl (LATUDA) 20 MG TABS   Yes No   Sig: Take 20 mg by mouth nightly   famotidine (PEPCID) 20 MG tablet   Yes No   Sig: Take 1 tablet by mouth daily    pantoprazole (PROTONIX) 40 MG tablet   No No   Sig: Take 1 tablet by mouth 2 (two) times daily before meals   sertraline (ZOLOFT) 50 MG tablet   No No   Sig: Take 1 tablet by mouth daily   sucralfate (CARAFATE) 1 GM tablet   No No   Sig: Take 1 tablet by mouth 4 (four) times daily as needed      Facility-Administered Medications: None     Physical Exam:   Patient Vitals for the past 99 hrs:   BP Temp Pulse Resp SpO2 Weight   12/11/20 1819 145/84 97.5 F 105 18 100 % 65 kg (143 lb 4.8 oz)     GENERAL:  WDWN, no acute distress, non-toxic   SKIN:  Warm & Dry, no rash, no petechia.  HEAD:  NCAT. Sclerae are anicteric and aninjected, oropharynx is clear with moist mucous membranes. PERRL. EOMI. B TMs clear.  NECK:  Supple, .  LUNGS:  Clear to auscultation bilaterally. No wheezes, rales, rhonchi.   HEART:  RRR.  No murmurs, rubs, or gallops.   ABDOMEN:  Soft, mild epigastric tenderness.  No masses.  No involuntary guarding or rebound.   EXTREMITIES:  No obvious deformities.  Warm and well perfused.  No cyanosis, no edema.   NEUROLOGIC:  Alert; moves all extremities; speaking in sentences. Normal gait without ataxia.   PSYCHIATRIC:  Appropriate for age, time of day, and situation    Medications Given in the ED:  Medications   ondansetron [facility]) injection 4 mg (4 mg Intravenous Not Given 12/11/20 2052)   famotidine (PEPCID) injection 20 mg (20 mg Intravenous Not Given 12/11/20 2053)    Radiology Results:     Lab Results (abnormal results only):  Labs Reviewed   COMPREHENSIVE METABOLIC PANEL - Abnormal; Notable for the following components:       Result Value    ALBUMIN 3.1 (*)     All other components within normal limits   CBC, PLATELET & DIFFERENTIAL   MAGNESIUM   LIPASE   HCG QUALITATIVE SERUM    Other Results/Old Record review (e.g. ECG):       ED Course and Medical Decision-making:  29 year old female patient who is presenting with persistent gastric abdominal pain.  She is noted to be afebrile, hemodynamic stable,  nontoxic-appearing with a relatively benign abdominal exam.    Repeat labs obtained and unremarkable.     Plan was for symptomatic treatment but informed by nursing that the patient pulled out her IV and walked out of the ER.     We had previously had a discussion about medication regimen and encouraged her to call GI to see if her appointment could be expedited.     Patient/family educated on diagnosis(es); she states understanding and agrees with plan of care   Reasons to return to the ED were reviewed in detail. She agrees with this plan and disposition.    Condition on Discharge: Improved and Stable    Diagnosis/Diagnoses:  Epigastric abdominal pain    Angus Palms, MD  This Emergency Department patient encounter note was created using voice-recognition software and in real time during the ED visit.

## 2020-12-11 NOTE — Narrator Note (Signed)
Assumed of care of pt, Report received from [email or web address].RN.  Chart reviews per protocol including  Labs result this amand VS.  Verified Pt  Pt c/o pain and awaiting to be evaluated by ER MD.

## 2020-12-13 ENCOUNTER — Other Ambulatory Visit: Payer: Self-pay

## 2020-12-13 ENCOUNTER — Ambulatory Visit: Payer: No Typology Code available for payment source | Attending: Gastroenterology

## 2020-12-13 DIAGNOSIS — R1013 Epigastric pain: Secondary | ICD-10-CM | POA: Insufficient documentation

## 2020-12-13 DIAGNOSIS — R1114 Bilious vomiting: Secondary | ICD-10-CM | POA: Insufficient documentation

## 2020-12-13 NOTE — Progress Notes (Signed)
Stool done  Tillman Abide, [place], 12/13/2020

## 2020-12-14 LAB — HELICOBACTER PYLORI STOOL AG

## 2020-12-17 ENCOUNTER — Encounter (HOSPITAL_BASED_OUTPATIENT_CLINIC_OR_DEPARTMENT_OTHER): Payer: Self-pay | Admitting: Gastroenterology

## 2020-12-19 ENCOUNTER — Ambulatory Visit
Admission: RE | Admit: 2020-12-19 | Discharge: 2020-12-19 | Disposition: A | Discharge status: Home / Self Care | Payer: No Typology Code available for payment source | Attending: Family Medicine | Admitting: Family Medicine

## 2020-12-19 ENCOUNTER — Telehealth (HOSPITAL_BASED_OUTPATIENT_CLINIC_OR_DEPARTMENT_OTHER): Payer: Self-pay | Admitting: General Practice

## 2020-12-19 DIAGNOSIS — U071 COVID-19: Secondary | ICD-10-CM | POA: Diagnosis not present

## 2020-12-19 NOTE — Telephone Encounter (Signed)
Called patient via telephone interpreter [place]). Patient scheduled for EGD 12/20/20. EGD prep teaching provided over the phone. Patient confirmed understanding and had no further questions.

## 2020-12-20 ENCOUNTER — Other Ambulatory Visit (HOSPITAL_BASED_OUTPATIENT_CLINIC_OR_DEPARTMENT_OTHER): Payer: Self-pay

## 2020-12-20 LAB — COVID-19 OUTPATIENT: COVID-19 OUTPATIENT: POSITIVE

## 2020-12-21 ENCOUNTER — Ambulatory Visit: Payer: No Typology Code available for payment source | Attending: Family Medicine | Admitting: Physician Assistant

## 2020-12-21 ENCOUNTER — Ambulatory Visit (HOSPITAL_BASED_OUTPATIENT_CLINIC_OR_DEPARTMENT_OTHER): Payer: Self-pay | Admitting: Family Medicine

## 2020-12-21 DIAGNOSIS — U071 COVID-19: Secondary | ICD-10-CM | POA: Diagnosis not present

## 2020-12-21 DIAGNOSIS — R1013 Epigastric pain: Secondary | ICD-10-CM | POA: Diagnosis present

## 2020-12-21 DIAGNOSIS — G479 Sleep disorder, unspecified: Secondary | ICD-10-CM

## 2020-12-21 DIAGNOSIS — F322 Major depressive disorder, single episode, severe without psychotic features: Secondary | ICD-10-CM | POA: Diagnosis present

## 2020-12-21 MED ORDER — BENZONATATE 200 MG PO CAPS
200.00 mg | ORAL_CAPSULE | Freq: Three times a day (TID) | ORAL | 0 refills | Status: AC | PRN
Start: 2020-12-21 — End: 2020-12-31

## 2020-12-21 MED ORDER — ESCITALOPRAM OXALATE 10 MG PO TABS
10.00 mg | ORAL_TABLET | Freq: Every day | ORAL | 0 refills | Status: AC
Start: 2020-12-21 — End: 2021-01-20

## 2020-12-21 MED ORDER — HYDROXYZINE HCL 25 MG PO TABS
25.00 mg | ORAL_TABLET | Freq: Three times a day (TID) | ORAL | 0 refills | Status: AC | PRN
Start: 2020-12-21 — End: 2021-01-20

## 2020-12-21 MED ORDER — FLUTICASONE PROPIONATE 50 MCG/ACT NA SUSP
1.00 | Freq: Every day | NASAL | 0 refills | Status: AC
Start: 2020-12-21 — End: 2020-12-31

## 2020-12-21 MED FILL — ESCITALOPRAM 10MG: 30 days supply | Qty: 30 | Fill #0

## 2020-12-21 MED FILL — FLUTICASONE SPR 50MCG: 30 days supply | Qty: 16 | Fill #0

## 2020-12-21 MED FILL — BENZONATATE 200MG: 10 days supply | Qty: 30 | Fill #0

## 2020-12-21 MED FILL — HYDROXYZ HCL 25MG: 20 days supply | Qty: 60 | Fill #0

## 2020-12-21 NOTE — Progress Notes (Signed)
[facility]  Televisit Note   Subjective:   Danielle James is a 30 year old female patient who presents via televisit today for     COVID  Diagnosed 2 days ago  Symptoms started 4 days ago  Runny nose, cough, fever, body aches  No SOB, CP  Not taking anything   Had to do test for endoscopy  Going back to [place] in 2-3 weeks     zoloft gave her stomach pain and insomnia  Took 1 pill of sertraline  No SI thoughts  Depressed about stomach pain   Went to ED   Has diarrhea for 3 weeks  One BM daily   None of the medication helped   Famotidine helped some   Last week was taking famotidine and it wasn't  helping anymore       ROS: see HPI     Medications, Allergies, FHx and Social Hx reviewed in Epic and current.        Objective:   Gen: NAD per conversation   Pulm: speaking in full sentences, no cough   Psych: alert, affect normal, thought process goal-oriented     Assessment & Plan:      1. MDD (major depressive disorder), severe (HCC)  Hx of depression and anxiety. Was previously on lurasidone. Per psych recs switched to sertraline given no hx of bipolar. Reports took one dose of sertraline which caused abdominal pain and insomnia.  Had no cross taper, but stopped sertraline after one dose. It is likely patient experienced withdrawal symptoms including vomiting, diarrhea, abdominal pain. Unlikely current abdominal pain as below is due to withdrawal given symptoms for weeks. No safety concerns. Will trial lexapro. Advised to start with 0.5 tablet and if feeling ok after a week increase to full tablet. Has a follow up scheduled with PCP on 01/07/2021  - escitalopram (LEXAPRO) 10 MG tablet; Take 1 tablet by mouth daily Start with 0.5 tablet. If doing ok after 4-7 days can take full tablet  Dispense: 30 tablet; Refill: 0    2. Abdominal pain, epigastric  30 yo with chronic epigastric pain with 2 recent ED visits and followed by GI. Reports inability to keep food down, weight loss, diarrhea in addition to pain.  History of gastritis in [place] improved with famotidine. Was prescribed pantoprazole, famotidine, carafate without relief this time. Ddx: GERD/gastritis vs cholelithiasis.   - Has abdominal ultrasound coming up  - EGD was cancelled due to positive COVID test, next not available until April. Sent urgent EGD referral to try and get sooner appointment given sx significantly affecting patient's life  - Advised to schedule follow up with the GI  - Will check shisto/strongy - history of treatment, will check clearance  - H.pylori was negative   - Discussed increasing pantoprazole dose and zofran for vomiting - patient declined given pain worsened with medication. Also denies nausea, reports vomiting due to pain  - SCHISTOSOMAL AB IGG; Future  - STRONGYLOIDES AB IGG; Future  - EGD; Future    3. Sleep difficulties  Reports hard time sleeping due to pain. Requesting medication to help with sleep. Denied medication for stomach pain since she has hard time keeping things down.   - hydrOXYzine (ATARAX) 25 MG tablet; Take 1 tablet by mouth 3 (three) times daily as needed for Anxiety  Dispense: 60 tablet; Refill: 0    4. COVID-19  Tested positive on 12/19/2020 with symptoms onset 4 days prior. Has completed quarantine as consistent with CDC  guidance and symptoms have been improving. Advised to continue wearing a mask. Will be traveling to [place] in 2-3 weeks and requesting a letter to clear her for flying saying she is no longer contagious. Advised that we cannot clear her for flying but can send a letter indicating she had a covid-19 infection with a date positive test resulted.  - Discussed it 's up to the individual airline on whether this will be accepted as well as the country of origin. Advised to call the airline and check [place]'s entry policy   - letter sent to mychart   - benzonatate (TESSALON) 200 MG capsule; Take 1 capsule by mouth 3 (three) times daily as needed for Cough  for up to 10 days  Dispense: 30 capsule;  Refill: 0  - fluticasone (FLONASE) 50 MCG/ACT nasal spray; 1 spray by Each Nostril route daily  for 10 days  Dispense: 1 each; Refill: 0      _____________________    1. The patient indicates understanding of these issues and agrees with the plan.  2. I reviewed the patient's medical information and medical history   3.  I reconciled the patient's medication list and prepared and supplied needed refills.  4.  I have reviewed the past medical, family, and social history sections including the medications and allergies listed in the above medical record    Gaynell Face, PA-C, 12/21/2020

## 2020-12-22 ENCOUNTER — Telehealth (HOSPITAL_BASED_OUTPATIENT_CLINIC_OR_DEPARTMENT_OTHER): Payer: Self-pay | Admitting: Registered Nurse

## 2020-12-22 NOTE — Telephone Encounter (Signed)
-----   Message from Valentina Gu sent at 12/21/2020  2:53 PM EST -----  Regarding: Letter  Contact: [phone]  Seher Schlagel [phone], 30 year old, female    Calls today:  Clinical Questions ([place])    Name of person calling   Specific nature of request Patient will be traveling to [place] January 16th but states she will finish with quarantine on the 10th but is concerned when testing it will give a positive results, wants a letter stating she finished quarantine is not contagious to aboard flight. Thank you   Return phone number [phone]  Person calling on behalf of patient: Patient (self)      Patient's language of care: [place]    Patient does not need an interpreter.    Patient's PCP: Terence Lux, MD

## 2020-12-24 ENCOUNTER — Ambulatory Visit: Payer: No Typology Code available for payment source | Attending: Family Medicine | Admitting: Family Medicine

## 2020-12-24 ENCOUNTER — Other Ambulatory Visit (HOSPITAL_BASED_OUTPATIENT_CLINIC_OR_DEPARTMENT_OTHER): Payer: Self-pay

## 2020-12-24 DIAGNOSIS — U071 COVID-19: Secondary | ICD-10-CM

## 2020-12-24 NOTE — Progress Notes (Signed)
[facility]  Televisit Note   Subjective:   Danielle James is a 30 year old female patient who presents via televisit today for f/up COVID.     COVID  Diagnosed 1/5 via PCR test, done for pre-op test   Did rapid antigen test yesterday and it was negative  Supposed to travel to [place] at the end of the month, worried PCR will still be positive   Symptoms: rhinorrhea now; had HA, myalgia, cough, sneezing at first - all resolved; no SOB or CP   Symptoms started 1-2 days before positive test     ROS: see HPI     Medications, Allergies, FHx and Social Hx reviewed in Epic and current.        Objective:   Gen: NAD per conversation   Pulm: speaking in full sentences, no cough   Psych: alert, affect normal, thought process goal-oriented     Assessment & Plan:    1. COVID-19  Day 7 of symptoms, improving without red flag symptoms. Reviewed isolation + masking guidelines, testing & quarantine for close contacts. Already had neg rapid test yesterday. Pt requesting letter indicating that PCR can be positive for up to 90 days after initial positive test. Discussed this is rare but can occur, more commonly up to 6 weeks. Letter written and sent via MyChart. Reviewed return precautions      Next visit: PRN    _____________________    1. The patient indicates understanding of these issues and agrees with the plan.  2. I reviewed the patient's medical information and medical history   3.  I reconciled the patient's medication list and prepared and supplied needed refills.  4.  I have reviewed the past medical, family, and social history sections including the medications and allergies listed in the above medical record    Alisa Graff, MD, 12/24/2020

## 2020-12-26 ENCOUNTER — Ambulatory Visit: Payer: No Typology Code available for payment source

## 2020-12-26 DIAGNOSIS — F322 Major depressive disorder, single episode, severe without psychotic features: Secondary | ICD-10-CM

## 2020-12-26 DIAGNOSIS — F5102 Adjustment insomnia: Secondary | ICD-10-CM | POA: Diagnosis present

## 2020-12-26 DIAGNOSIS — F4310 Post-traumatic stress disorder, unspecified: Secondary | ICD-10-CM | POA: Diagnosis present

## 2020-12-26 NOTE — Progress Notes (Signed)
ADULT OUTPATIENT PSYCHIATRY     THERAPY INITIAL NOTE TEMPLATE    REFERRED BY:    Rhunette Croft, MD  [employer]  [address]  [place],  [place] [address]    REASON FOR REFERRAL: MDD, PTSD    CHIEF COMPLAINT: insomnia, pain    HISTORY OF PRESENT ILLNESS:      Health    Gastritis   Pain   Vomiting   Unable to sleep due to above symptoms   Frustrated that clonazepam was discontinued as pt feels this was the only thing that helped with sleep and pain   Recent psychopharm visit with Dr Ronalee Red on 11/23/20 and was switched to sertraline   Pt declined referral to psychotherapy during above visit but was scheduled for today's intake before then as the referral had already previously been placed by PCP in October 2021   Pt doesn't know why she was scheduled for this current visit and doesn't remember there being a PCP referral for therapy   Waiting for colonoscopy to be rescheduled as well as part of her main focus at the moment in addition to finding a way to restart clonazepam   Insomnia   Difficulty both falling asleep and staying asleep   Reports sleeping 4-6 hours depending on her pain level (see above)   Not interested in psychotherapy for insomnia or stress and is only interested in restarting clonazepam and plans to return to [place] after covid pandemic to get this medication again   Mood   Denies current depressive symptoms   More concerned about her gastric pain   Elevated PHQ-9, score of 23 on 09/19/20   Denies SI   Anxiety   Difficulty meeting other people, supermarkets places with a lot of people   Feels better at home   Tries to go with someone and not alone    Fears having an anxiety attack if goes outside alone   Shortness of breath   Thinks about how to leave      Coping - video games, talking with husband, read,    denies other strategies, feels deep breathing isn't helpful for pain or insomnia but last tried years ago      CURRENT MEDICATIONS:    Current Outpatient  Medications   Medication Sig    hydrOXYzine (ATARAX) 25 MG tablet Take 1 tablet by mouth 3 (three) times daily as needed for Anxiety    benzonatate (TESSALON) 200 MG capsule Take 1 capsule by mouth 3 (three) times daily as needed for Cough  for up to 10 days    fluticasone (FLONASE) 50 MCG/ACT nasal spray 1 spray by Each Nostril route daily  for 10 days    escitalopram (LEXAPRO) 10 MG tablet Take 1 tablet by mouth daily Start with 0.5 tablet. If doing ok after 4-7 days can take full tablet    pantoprazole (PROTONIX) 40 MG tablet Take 1 tablet by mouth 2 (two) times daily before meals    Lurasidone HCl (LATUDA) 20 MG TABS Take 20 mg by mouth nightly    famotidine (PEPCID) 20 MG tablet Take 1 tablet by mouth daily    sucralfate (CARAFATE) 1 GM tablet Take 1 tablet by mouth 4 (four) times daily as needed     No current facility-administered medications for this visit.         LANGUAGE OF CARE:    [place]      LANGUAGE NEEDS MET:    Telephone Interpreter  CURRENT TREATMENT/CONTACT INFO FOR OTHER AGENCIES AND MENTAL HEALTH PROVIDERS (if applicable):    Contact/Community Support    No documentation.                 MEDICAL HISTORY:  No past medical history on file.     PROBLEM LIST:  Patient Active Problem List:     MDD (major depressive disorder), severe (HCC)     PTSD (post-traumatic stress disorder)     Schistosomiasis     Epigastric pain     Annual physical exam     COVID-19      BIOLOGICAL FAMILY HISTORY/FAMILY CONSTELLATION:    No family history on file.       PAST PSYCHIATRIC HISTORY:   Past Psychiatric History: - 12/26/20 1635        PAST PSYCHIATRIC HISTORY    Past Psychiatric Diagnosis: MDD, PTSD, GAD     [place] Psychiatric Medications: clonazepam, others unknown     Family History of Psychiatric Disorders: unknown     Treatment History: 2019 - psych inpt stay for MDD; 2019 - [facility] visits ; 2020 In [place]; reported having seen therapists in [place] since age 40 on and off when  feeling improvement due to bullying and MDD     Trauma History: pt denies, however history of PTSD per chart                  Depression   History of MDD likely since adolescence or earlier as pt started seeing mental health providers around this time   History of SI   2019 Major stressors - father murdered someone and then was incarcerated   family support has been helpful in contributing to decrease depressive symptoms since then     Denies trauma history however has diagnosis of PTSD per chart     Unable to assess further history as visit started late, use of interpreter, and pt discussion of primary concern of pain and insomnia    PSYCHOSOCIAL:   OP Psychosocial Review - 12/26/20 1730        Psychosocial    Education  UTA     Relationship Status Partnered     Cultural Preferences From [place]     Needs Expressed Physical     Leisure and Recreation Activities video games, reading     Social Factors Access to [place] she has adequate social support from family      SUBSTANCE USE:     Substance Use - 12/26/20 1649        Alcohol    * Alcohol Use in past 12 months N        Substance Use Screen    * Used chemicals (other than as prescribed) in the past 12 months? N                  [place] RISK ASSESSMENTS:   Suicide:   C-SSRS OP Triage Screener - 12/26/20 1639        C-SSRS OP Triage Screener    Within the past month, have you wished you were dead or wished you could go to sleep and not wake up?  No  Within the past month, have you had any actual thoughts of killing yourself?  No     In your lifetime, have you ever done anything, started to do anything, or prepared to do anything to end your life? No     C-SSRS OP Triage Screener Risk Score No Risk     C-SSRS OP Triage Screener Risk Score 0                C-SSRS Risk Assessment - 12/26/20 1731        C-SSRS  Risk Assessment    Protective Factors (Recent) Responsibility to family or others, living with family                 VIOLENCE:    Violence/Abuse Risk - 12/26/20 1731        Domestic Abuse Assessment    * Do you feel safe at home? Yes     * Do You Feel Unsafe in Your Relationship(s)? No     Abuse/Trauma History None   denied but hx of PTSD per chart    Abuse/Trauma (Current) None                 Protective Factors:   Protective Factors - 12/26/20 1731        Protective Factors    Protective Factors (Recent) Responsibility to family or others, living with family                  RISK ASSESSMENTS:       Violence: low (1)     Addiction: low (1)     ADDITIONAL SCREENINGS:   PHQ9 SCREENING 07/19/2018 09/19/2020 11/28/2020   Patient refused PHQ-9 No No No   Little interest or pleasure in doing things Nearly Every Day Nearly Every Day Not at all   Feeling down, depressed, or hopeless Nearly Every Day Nearly Every Day Not at all   Trouble falling asleep, staying asleep, or sleeping too much Nearly Every Day Nearly Every Day More than Half the Days   Feeling tired or having low energy Nearly Every Day Nearly Every Day More than Half the Days   Poor appetite or overeating Nearly Every Day Nearly Every Day More than Half the Days   Feeling bad about yourself - or that you are a failure or have let yourself or your family down Nearly Every Day Nearly Every Day Several Days   Trouble concentrating on things, such as school work, reading the newspaper, or watching television Nearly Every Day Nearly Every Day Several Days   Moving or speaking slowly that other people could have noticed.  Or the opposite - being so fidgety or restless that you have been  moving around a lot more than ususal Nearly Every Day More than Half the Days Not at all   Thoughts that you would be better off dead or of hurting yourself in some way Nearly Every Day Not at all Not at all   TOTAL 27 23 8    If you checked off any problems, how difficult have these  problems made it for you to do your work, take care of things at home, or get along with people? Extremely difficult Very difficult Somewhat difficult     GAD-7 FLOWSHEET 11/28/2020 09/19/2020 07/15/2018   Feeling Nervous/Anxious/ On Edge 2 3 3    Unable to Stop Worrying 2 3 3    Worrying Too Much about Different Things 2 3 3    Trouble Relaxing 2 3  3   Restless/Hard to Sit Still 0 0 3   Easily Annoyed/Irritable 1 2 3    Afraid As is Something Aweful 0 3 3   How difficult has it made you to do work, take care of things at home, or get along with other people Somewhat difficult Very difficult Very difficult   GAD-7 Score 9 17 21              MENTAL STATUS EXAMINATION:   Mental Status Exam - 12/26/20 1702        Mental Status    General Appearance Clean     Behavior Cooperative     Level of Consciousness Alert     Orientation Level Oriented x3     Attention/Concentration WNL     Mannerisms/Movements No abnormal mannerisms/movements     Speech Rate WNL     Speech Clarity Clear     Speech Tone Normal vocal inflection     Vocabulary/Fund of Knowledge WNL     Memory Intact     Thought Process Goal-directed;Linear;Logical     Dissociative Symptoms None     Thought Content No abnormalities reported or observed     Hallucinations None     Suicidal Thoughts None     Homicidal Thoughts None     Mood Euthymic     Affect Congruent w/mood     Judgment Fair     Insight Fair                 BIO/PSYCHO/SOCIAL AND RISK FORMULATION(S):  Sierria Bruney is a 30 year old female referred by PCP to [facility] for MDD and PTSD. Pt reported a history of MDD for many years likely since adolescence. Pt additionally has had behavioral health treatment since age 33 both psychotherapy and psychopharm in [place] and recently at [facility] in 2019 with inpt psych admission for MDD with SI as well as psychotherapy and psychopharm through [facility] afterwards. Per chart pt then moved back to [place]. Pt then had psychiatry consult visit 2 months ago and eval 1 month ago and  started on sertraline and returned to PCP. Pt reported having taken sertraline for about 2 weeks only and dissatisfied as it does not help like clonazepam has in the past.  Pt reports current concern about gastric pain that has been contributing to insomnia both difficulty falling asleep and staying asleep, having about 4-6 hours nightly. Pt was unaware of today's visit but willing to meet however expressed multiple times not being interested in psychotherapy. Pt does endorse stress and anxiety related to wanting to to find relief for her gastric pain and start clonazepam again, she seems adamant on restarting this medication only. Pt reports engaging in leisure activities to help manage her stress and pain but no other strategies and no strategies used to aid in better sleep.  Recent PHQ9 and GAD7 scores are WNL but GAD7 score is on the higher end showing presence of anxiety or stress. Pt does show a positive trend in her PHQ9 and GAD7 scores which were previously elevated months ago.   Pt has fair protective factors, is future oriented, and reports having adequate social support from her family at this time.  Pt denies all other mental health concerns. Denies SI. No risk concerns.  Pt's goal is to improve her pain and get further treatment for it without psychotherapy.         DIAGNOSES:     Adjustment insomnia  (primary encounter diagnosis)   SNOMED  CT(R): ACUTE INSOMNIA     MDD (major depressive disorder), severe ([place])   SNOMED CT(R): SEVERE MAJOR DEPRESSION     PTSD (post-traumatic stress disorder)   SNOMED CT(R): POSTTRAUMATIC STRESS DISORDER        PLAN:    Pt is not interested in psychotherapy at this time   Pt was given education on [facility] MHCP role. Pt educated to reach out to PCP if interested in speaking with MHCP.    Pt encouraged to reach out to PCP if interested in psychotherapy in the future. Pt given education on how psychotherapy can be effective for pain and insomnia.    Update PCP       CARE  PLAN/ EPISODES: n/a  No linked episodes         Pura Spice, PsyD

## 2021-01-02 ENCOUNTER — Encounter (HOSPITAL_BASED_OUTPATIENT_CLINIC_OR_DEPARTMENT_OTHER): Payer: Self-pay

## 2021-01-02 NOTE — Progress Notes (Signed)
[facility] Outreach:Called to schedule Urgent egd for abd pain+vomiting.Patient was cancelled earlier as + covid 12/19/20.She can be scheduled after 01/19/21.Left vm message+sent letter with call back info.Interpreter #[phone].

## 2021-01-07 ENCOUNTER — Telehealth (HOSPITAL_BASED_OUTPATIENT_CLINIC_OR_DEPARTMENT_OTHER): Payer: Self-pay

## 2021-01-07 ENCOUNTER — Ambulatory Visit (HOSPITAL_BASED_OUTPATIENT_CLINIC_OR_DEPARTMENT_OTHER): Payer: Self-pay | Admitting: Family Medicine

## 2021-01-07 NOTE — Telephone Encounter (Signed)
Returned call with interp, spoke with patient's family member who stated that patient went to [place] to seek medical there, encouraged family to have patient call upon return from travel if she still needs EGD.

## 2021-04-03 ENCOUNTER — Ambulatory Visit
Admission: RE | Admit: 2021-04-03 | Discharge: 2021-04-03 | Disposition: A | Discharge status: Home / Self Care | Payer: No Typology Code available for payment source | Attending: Family Medicine | Admitting: Family Medicine

## 2021-04-03 DIAGNOSIS — Z20822 Contact with and (suspected) exposure to covid-19: Secondary | ICD-10-CM

## 2021-04-05 ENCOUNTER — Other Ambulatory Visit (HOSPITAL_BASED_OUTPATIENT_CLINIC_OR_DEPARTMENT_OTHER): Payer: Self-pay

## 2021-07-09 ENCOUNTER — Ambulatory Visit (HOSPITAL_BASED_OUTPATIENT_CLINIC_OR_DEPARTMENT_OTHER): Payer: Self-pay | Admitting: Gastroenterology

## 2022-05-20 IMAGING — MR PELVE
5 of 9 series · 22 of 48 positions shown · non-contrast
Comparison: none

[Series 3: T2 · axial · 6.0mm · 0.47mm/px · z∈[-129,+12]mm · 5 of 20 slices shown (1 of 2)]
[im 1/20]
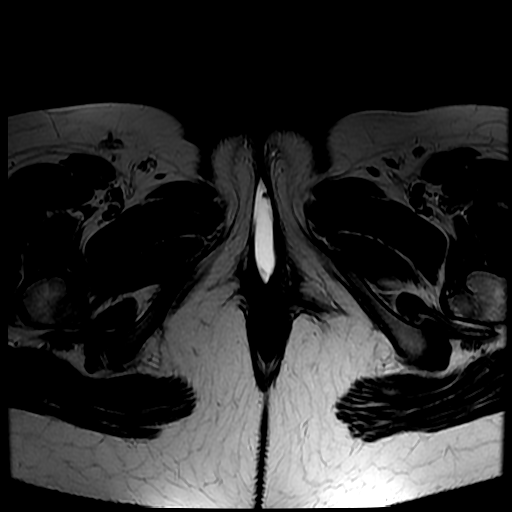
[im 5/20]
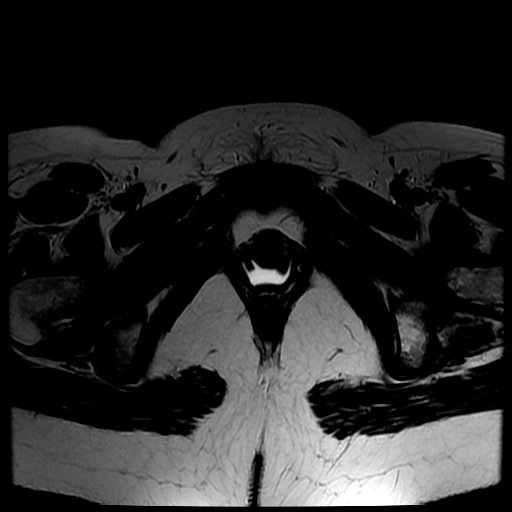
[im 10/20]
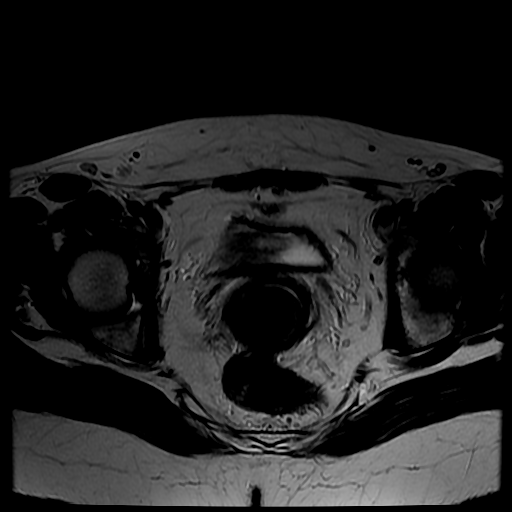
[im 15/20]
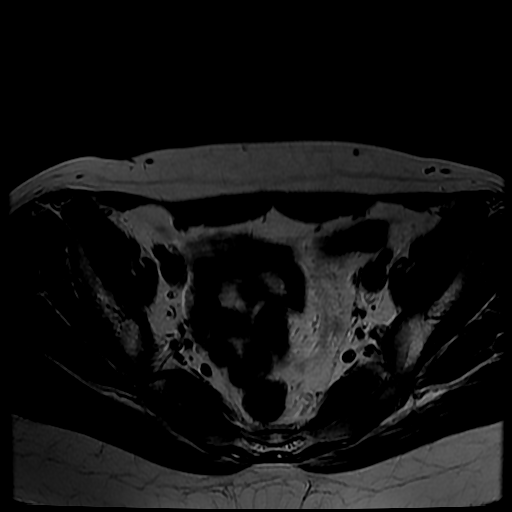
[im 20/20]
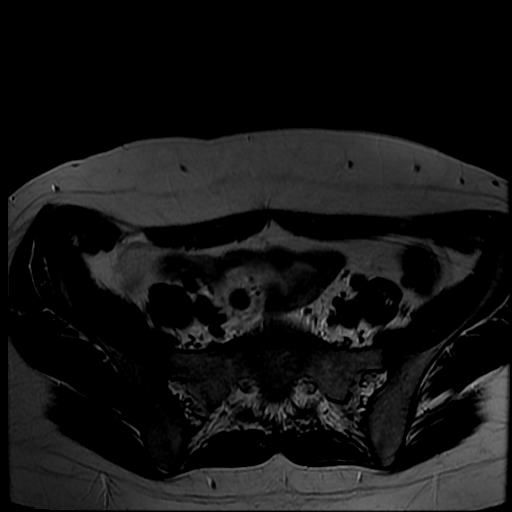

[Series 4: T1 · axial · 6.0mm · 0.47mm/px · z∈[-129,+12]mm · 5 of 20 slices shown (1 of 2)]
[im 1/20]
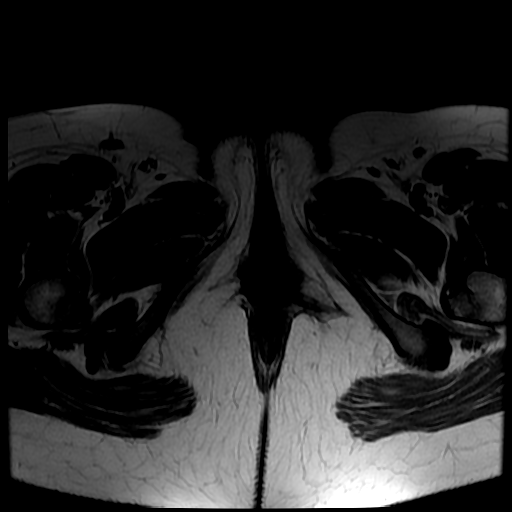
[im 5/20]
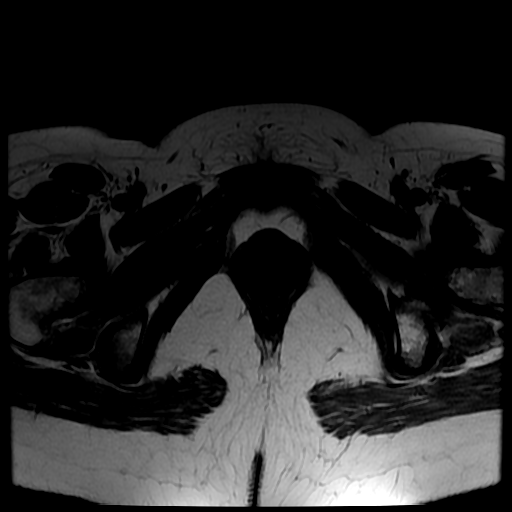
[im 10/20]
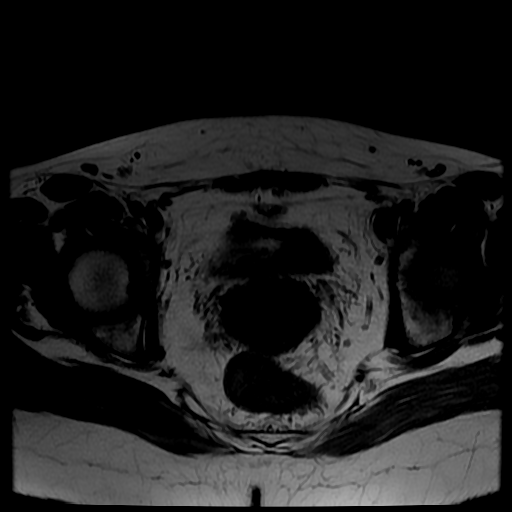
[im 15/20]
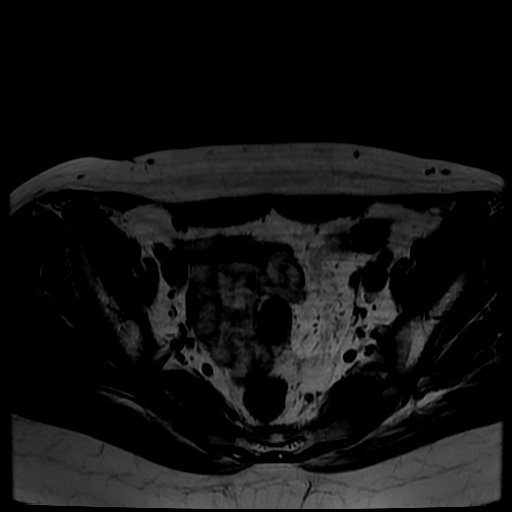
[im 20/20]
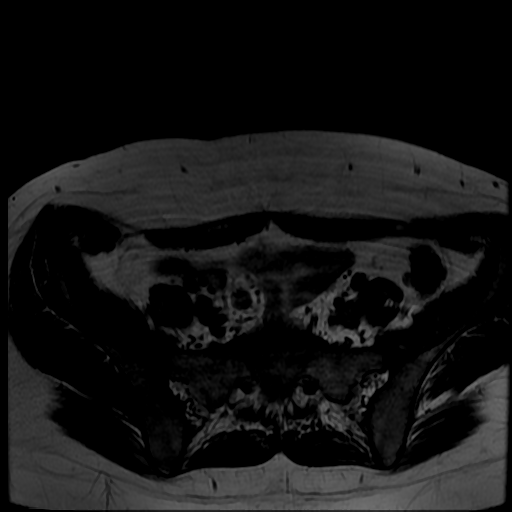

[Series 5: T2 · sagittal · 5.0mm · 0.47mm/px · 4 of 19 slices shown (2 of 2)]
[im 1/19]
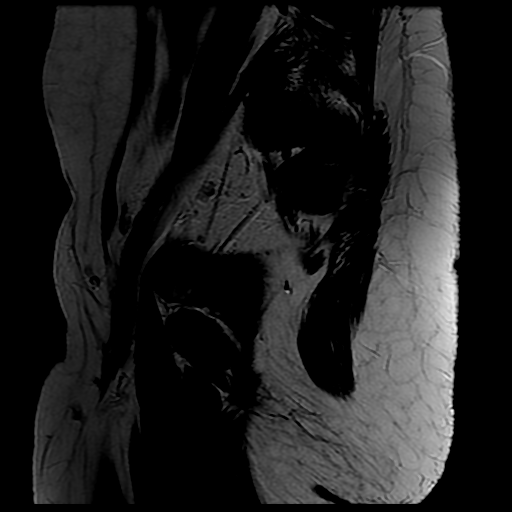
[im 7/19]
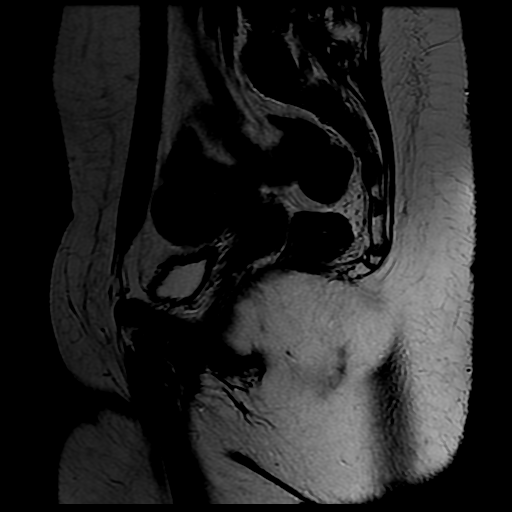
[im 13/19]
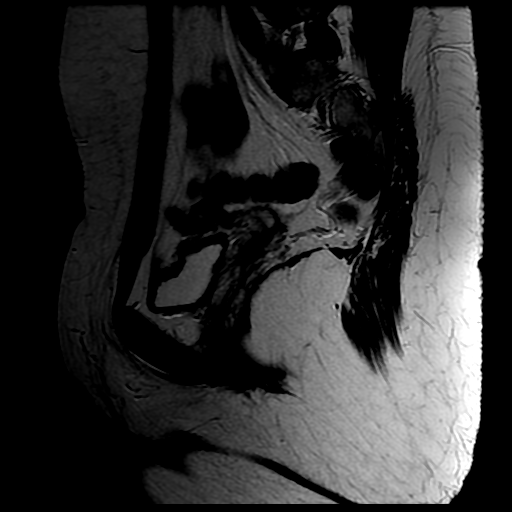
[im 19/19]
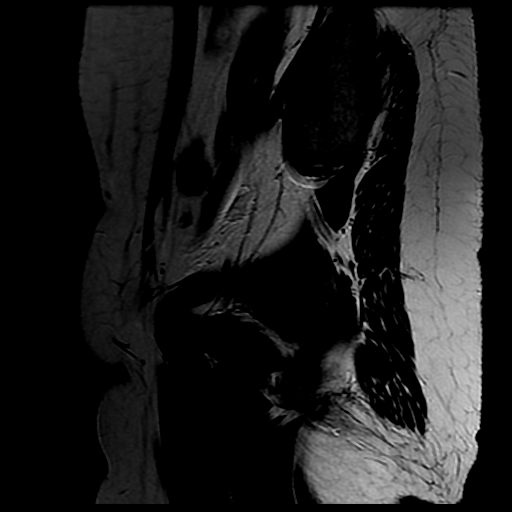

[Series 6: T1 · axial · 6.0mm · 0.47mm/px · z∈[-129,+12]mm · 4 of 20 slices shown (2 of 2)]
[im 1/20]
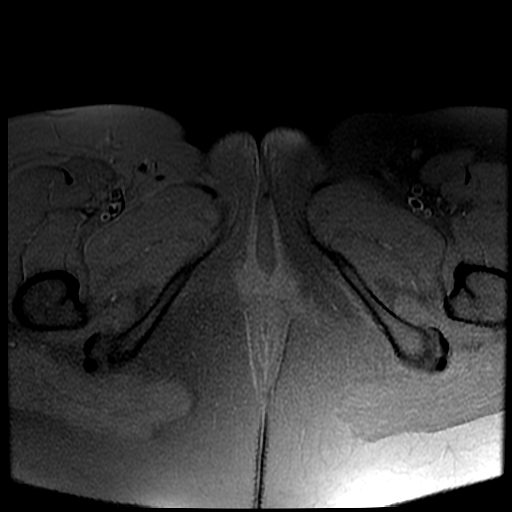
[im 7/20]
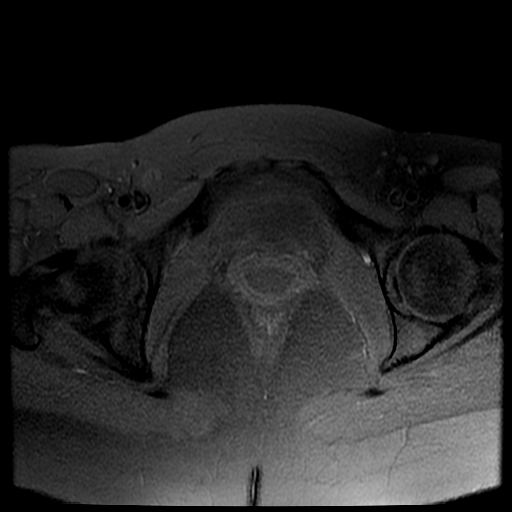
[im 13/20]
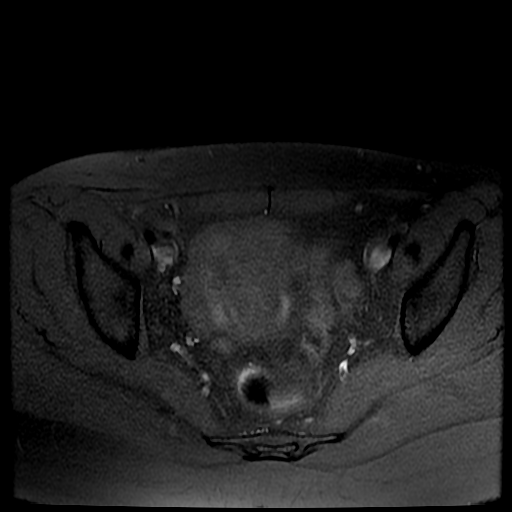
[im 20/20]
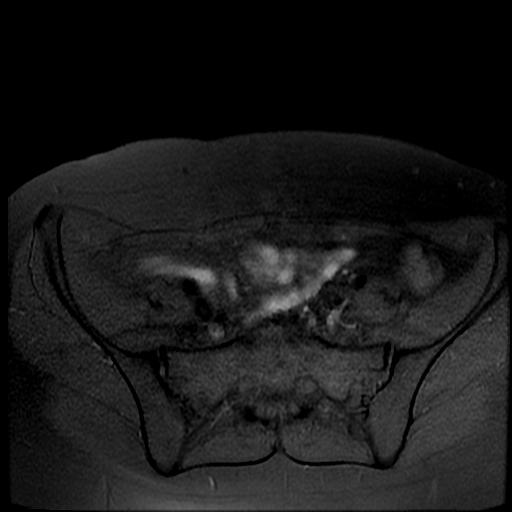

[Series 7: difusao pelve · axial · 6.0mm · 0.94mm/px · z∈[-129,-69]mm · 4 of 60 slices shown]
[im 1/60]
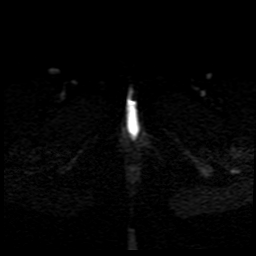
[im 10/60]
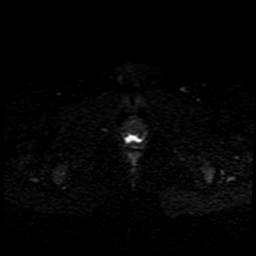
[im 20/60]
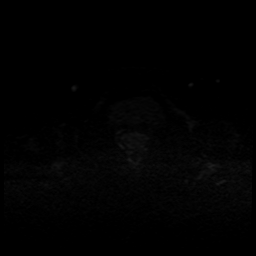
[im 25/60]
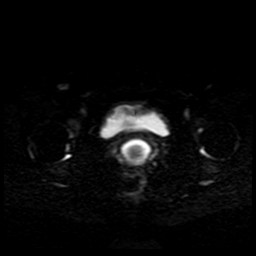

[22 of 48 positions shown; findings below may reference images not displayed]

Técnica:
Sequências multiplanares, ponderadas em T1 e T2, com contraste paramagnético endovenoso. Administrado
gel vaginal.
Relatório:
Estruturas ósseas íntegras. Planos miotendíneos regionais conservados.
RESSONÂNCIA MAGNÉTICA DA PELVE FEMININA
Bexiga com baixa repleção, paredes finas e conteúdo de sinal homogêneo. Meatos ureterais livres.
Canal vaginal sem particularidades.
Útero em anteversoflexão, centralizado, com dimensões preservadas e contornos regulares. Mede 6,6 x 3,3 x
4,4 cm, volume estimado em 50,1 cm³.
Miométrio com intensidade de sinal habitual, sem definição de nódulos.
Zona juncional irregular, com assimetria das paredes uterinas, com zona juncional medindo até 20,0 mm.
Endométrio homogêneo e centrado, com espessura de 0,2 cm.
Colo uterino e canal cervical preservados.
Ligamentos uterossacros e redondos íntegros. Espaços vesicuterino, retrocervical e do septo retovaginal
livres.
Ovário direito tópico, com dimensões normais, contornos regulares e intensidade de sinal habitual. O ovário
mede 1,0 x 1,9 x 1,3 cm, volume estimado em 1,2 cm³.
Ovário esquerdo tópico, com dimensões normais, contornos regulares e intensidade de sinal habitual.
Apresenta folículos habituais de até 0,3 cm. O ovário mede 1,5 x 1,0 x 2,5 cm, volume estimado em 1,9
cm³.
Canal anal e reto de configuração anatômica.
Edneuma Iva
Ausência de líquido livre patológico na cavidade.
Não há evidência de linfonodomegalias pélvicas.
Impressão:
Sinais sugestivos de adenomiose.
Edneuma Iva

## 2023-10-19 IMAGING — MR RM SELA TURCICA
8 of 20 series · 12 of 48 positions shown · non-contrast
Comparison: none

[Series 2: FLAIR · axial · 5.0mm · 0.49mm/px · 1 of 24 slices shown]
[im 1/24]
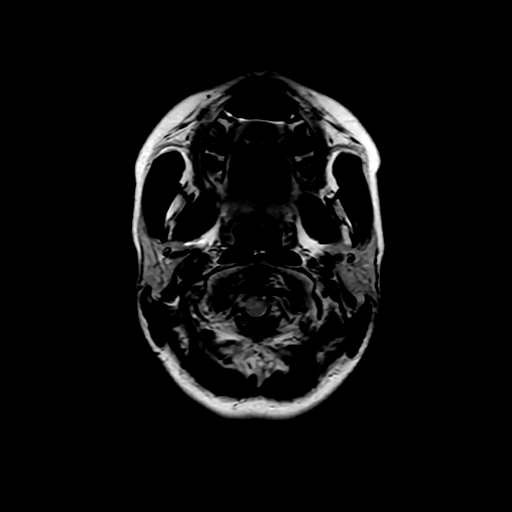

[Series 3: T1 · sagittal · 2.5mm · 0.13mm/px · 1 of 12 slices shown (1 of 3)]
[im 1/12]
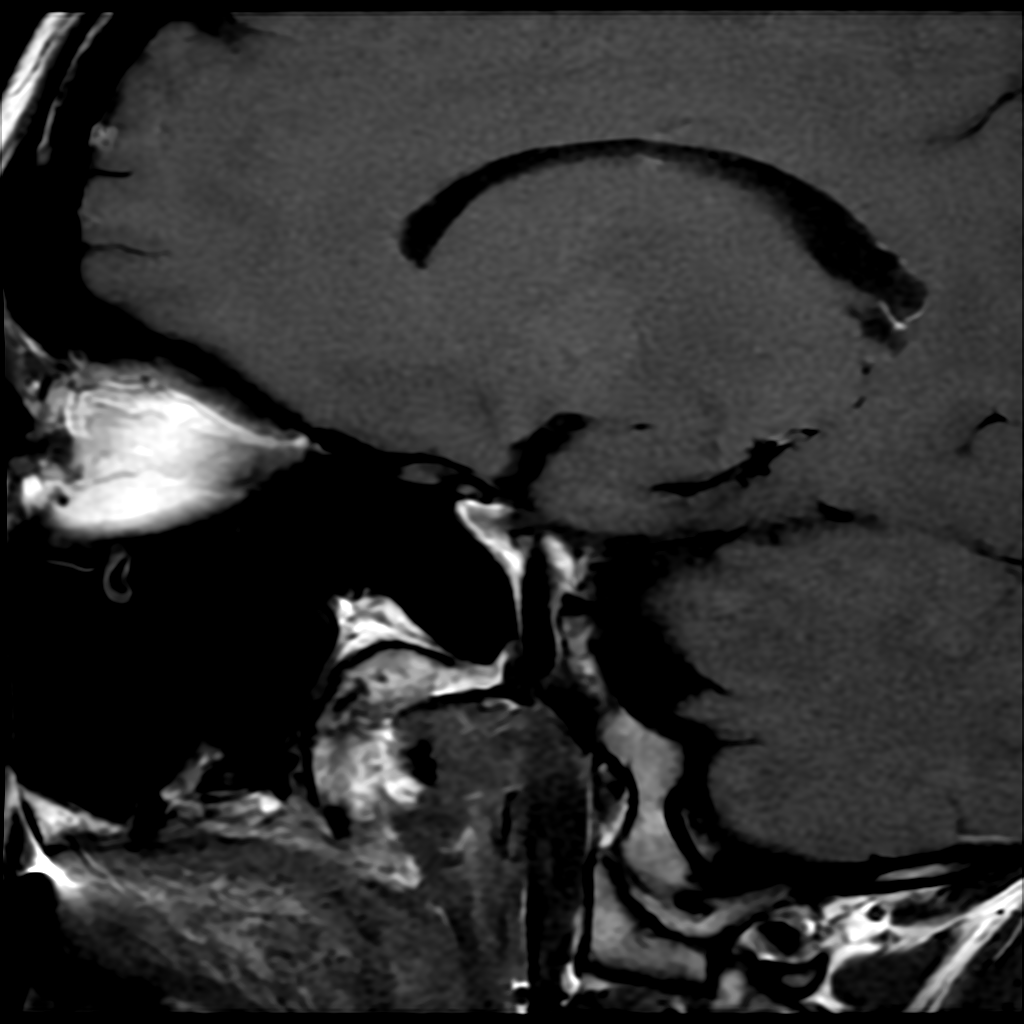

[Series 4: DWI · axial · 5.0mm · 0.98mm/px · z∈[-21,+113]mm · 3 of 48 slices shown]
[im 1/48]
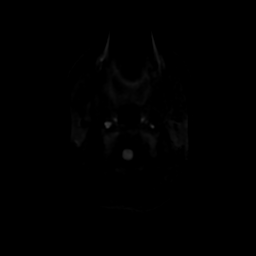
[im 24/48]
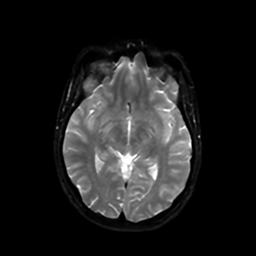
[im 48/48]
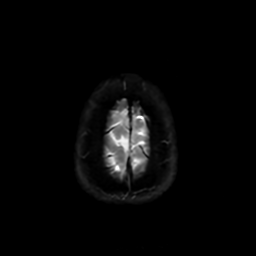

[Series 5: T2 · coronal · 2.5mm · 0.12mm/px · 1 of 9 slices shown]
[im 1/9]
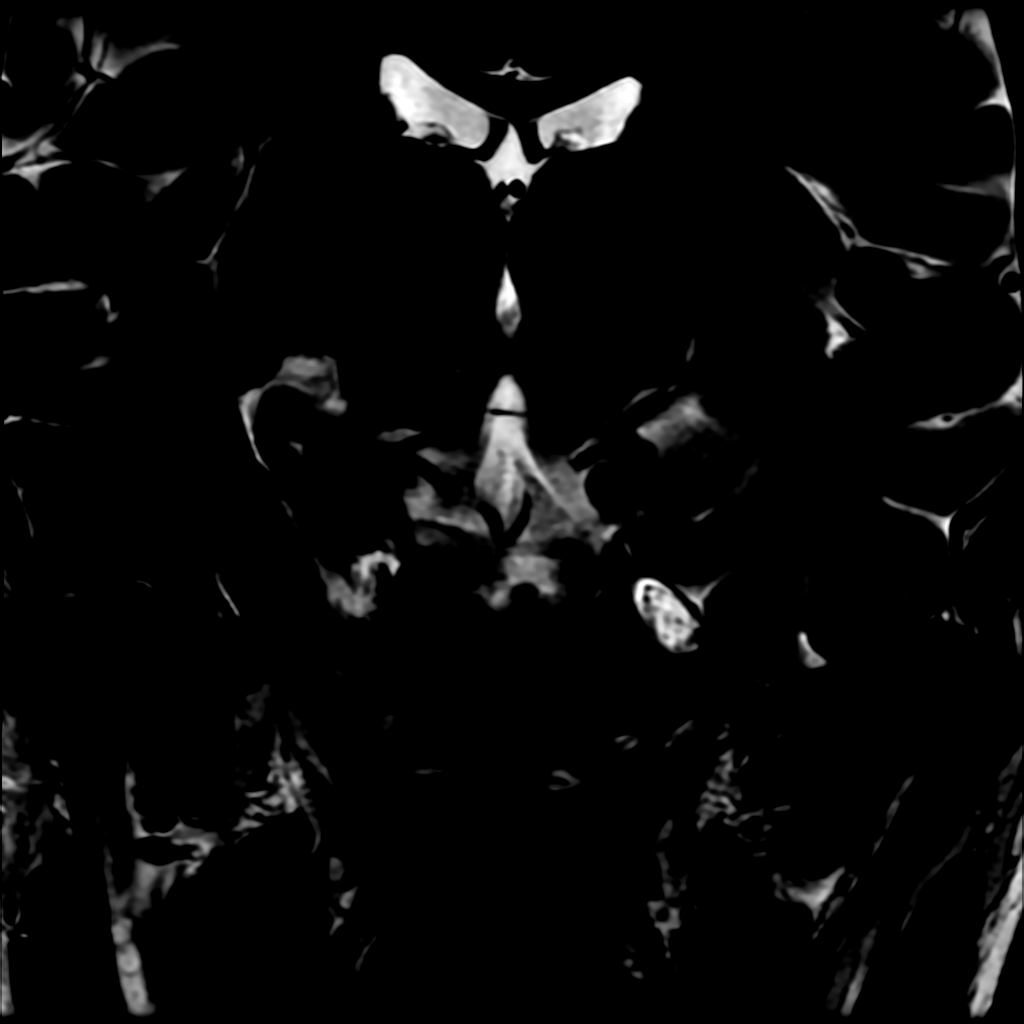

[Series 6: T1 · coronal · 2.5mm · 0.12mm/px · 1 of 9 slices shown (2 of 3)]
[im 1/9]
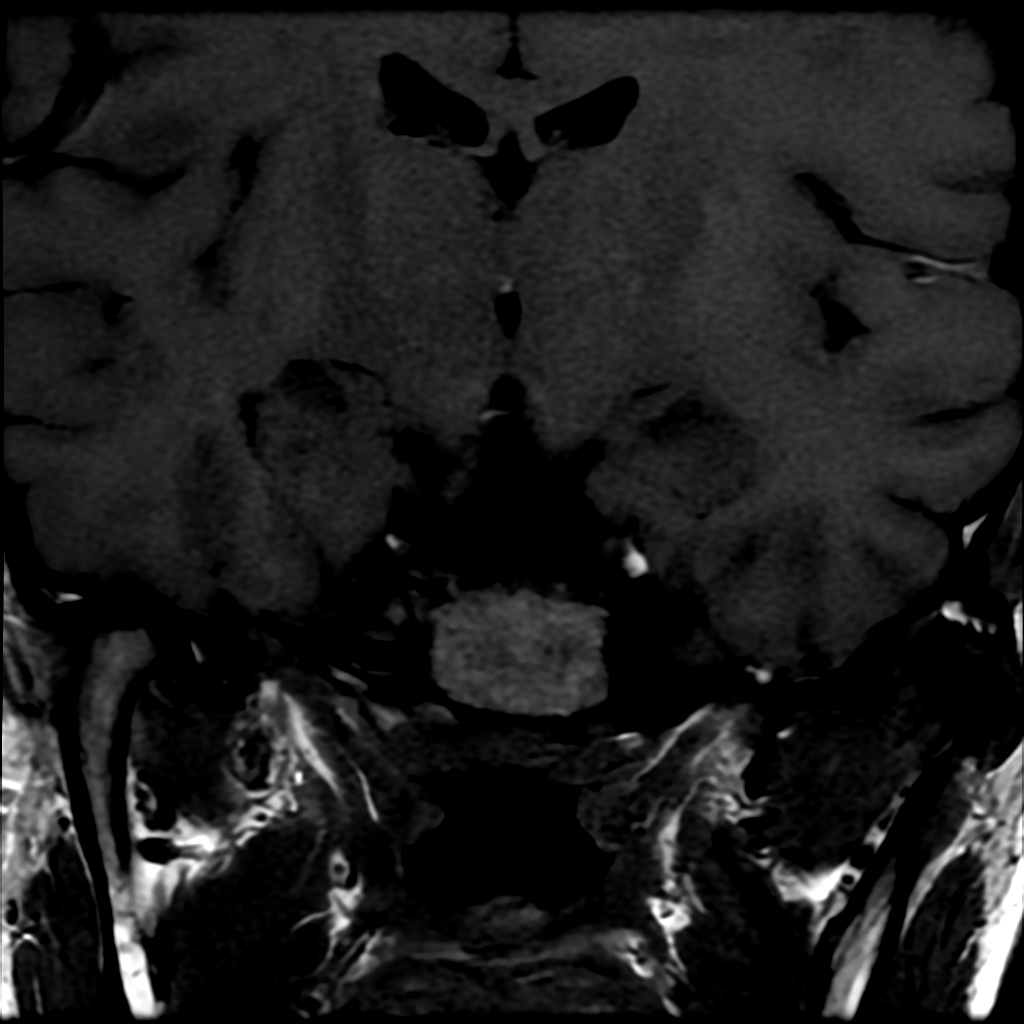

[Series 8: T1 · sagittal · 2.5mm · 0.13mm/px · 1 of 12 slices shown (3 of 3)]
[im 1/12]
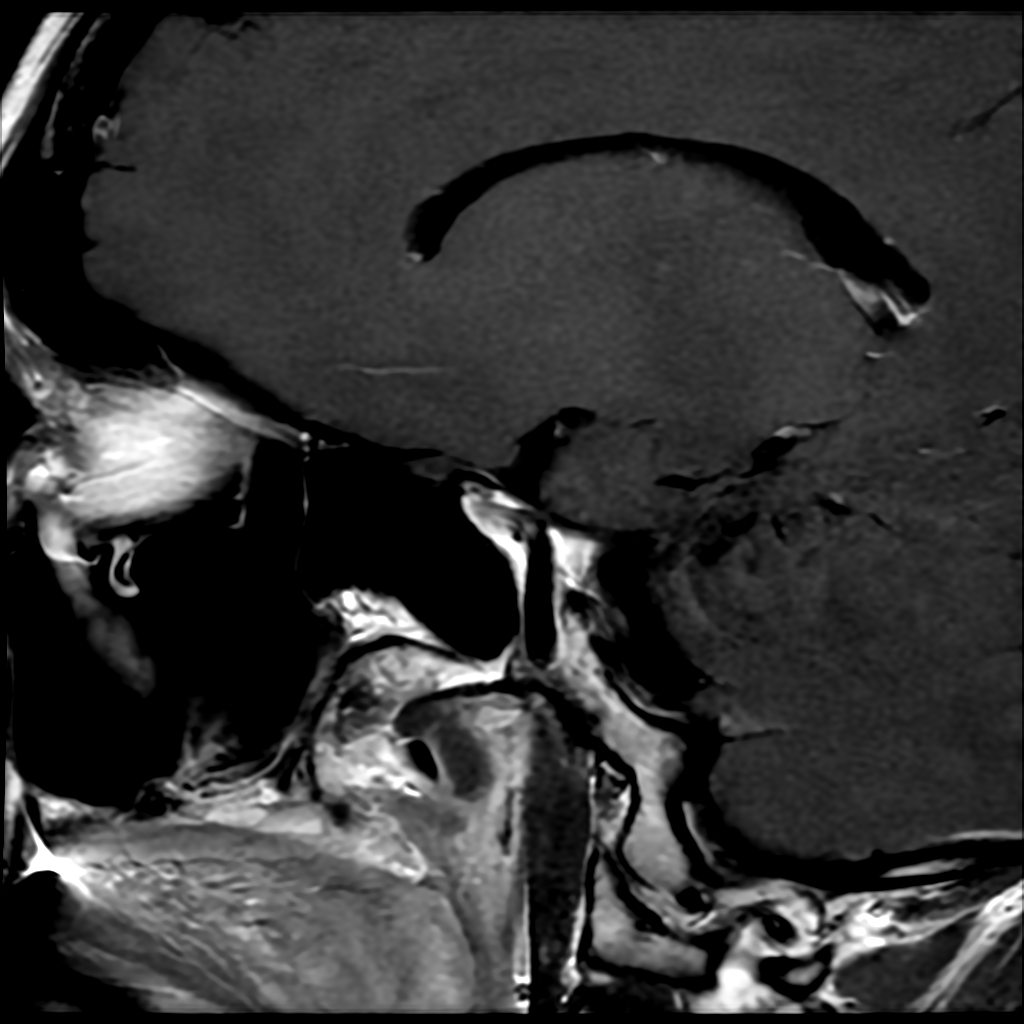

[Series 450: ADC · axial · 5.0mm · 0.98mm/px · z∈[-21,+113]mm · 2 of 24 slices shown (1 of 2)]
[im 1/24]
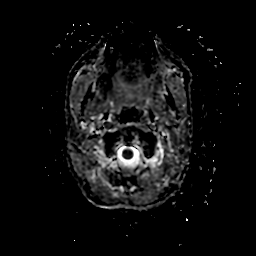
[im 24/24]
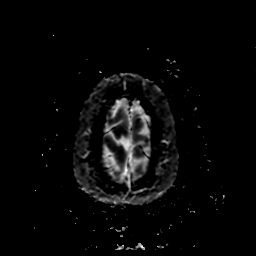

[Series 452: ADC · axial · 5.0mm · 0.98mm/px · z∈[-21,+113]mm · 2 of 24 slices shown (2 of 2)]
[im 1/24]
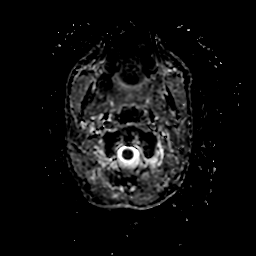
[im 24/24]
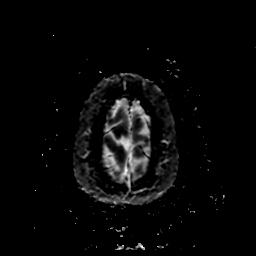

[12 of 48 positions shown; findings below may reference images not displayed]

METODOLOGIA:
Exame realizado com sequências ponderadas em T1 e T2, antes e após a administração intravenosa do
agente de contraste paramagnético.
ANÁLISE:
Sela túrcica com morfologia e dimensões normais.
Questionável tênue imagem ovalada com discreto menor realce pelo contraste na fase dinâmica, localizada no
RESSONÂNCIA MAGNÉTICA DA SELA TÚRCICA
aspecto lateral esquerda da hipófise, medindo 5,0 mm, não se podendo afastar a possibilidade de pequeno
nódulo (microadenoma?).
Neuro-hipófise sem alterações.
Haste hipofisária com espessura e impregnação normais.
Quiasma óptico, seios cavernosos e cavuns de [place] sem alterações.
O sistema ventricular é de topografia, morfologia e dimensões normais.
Seios esfenoidais com intensidade de sinal normal.
IMPRESSÃO:
Questionável tênue imagem ovalada com discreto menor realce pelo contraste na fase dinâmica, localizada no
aspecto lateral esquerda da hipófise, medindo 5,0 mm, não se podendo afastar a possibilidade de pequeno
nódulo (microadenoma?).
